# Patient Record
Sex: Female | Born: 1955
Health system: Southern US, Community
[De-identification: ages and names within clinical notes are randomized; demographics above are authoritative.]

## PROBLEM LIST (undated history)

## (undated) DIAGNOSIS — R87619 Unspecified abnormal cytological findings in specimens from cervix uteri: Secondary | ICD-10-CM

## (undated) DIAGNOSIS — J45909 Unspecified asthma, uncomplicated: Secondary | ICD-10-CM

## (undated) DIAGNOSIS — T7840XA Allergy, unspecified, initial encounter: Secondary | ICD-10-CM

## (undated) DIAGNOSIS — E785 Hyperlipidemia, unspecified: Secondary | ICD-10-CM

---

## 2014-11-05 ENCOUNTER — Emergency Department
Admission: EM | Admit: 2014-11-05 | Discharge: 2014-11-05 | Disposition: A | Discharge status: Home / Self Care | Payer: 59 | Attending: Emergency Medicine | Admitting: Emergency Medicine

## 2014-11-05 ENCOUNTER — Encounter: Payer: Self-pay | Admitting: Occupational Medicine

## 2014-11-05 ENCOUNTER — Emergency Department: Payer: 59

## 2014-11-05 DIAGNOSIS — S3992XA Unspecified injury of lower back, initial encounter: Secondary | ICD-10-CM | POA: Insufficient documentation

## 2014-11-05 DIAGNOSIS — S161XXA Strain of muscle, fascia and tendon at neck level, initial encounter: Secondary | ICD-10-CM | POA: Insufficient documentation

## 2014-11-05 DIAGNOSIS — Y9389 Activity, other specified: Secondary | ICD-10-CM | POA: Diagnosis not present

## 2014-11-05 DIAGNOSIS — Y998 Other external cause status: Secondary | ICD-10-CM | POA: Insufficient documentation

## 2014-11-05 DIAGNOSIS — Y92410 Unspecified street and highway as the place of occurrence of the external cause: Secondary | ICD-10-CM | POA: Diagnosis not present

## 2014-11-05 DIAGNOSIS — S199XXA Unspecified injury of neck, initial encounter: Secondary | ICD-10-CM | POA: Diagnosis present

## 2014-11-05 MED ORDER — TRAMADOL HCL 50 MG PO TABS: 50.0000 mg | ORAL_TABLET | Freq: Four times a day (QID) | ORAL | Status: AC | PRN

## 2014-11-05 NOTE — Discharge Instructions (Signed)
Cervical Sprain A cervical sprain is when the tissues (ligaments) that hold the neck bones in place stretch or tear. HOME CARE   Put ice on the injured area.  Put ice in a plastic bag.  Place a towel between your skin and the bag.  Leave the ice on for 15-20 minutes, 3-4 times a day.  You may have been given a collar to wear. This collar keeps your neck from moving while you heal.  Do not take the collar off unless told by your doctor.  If you have long hair, keep it outside of the collar.  Ask your doctor before changing the position of your collar. You may need to change its position over time to make it more comfortable.  If you are allowed to take off the collar for cleaning or bathing, follow your doctor's instructions on how to do it safely.  Keep your collar clean by wiping it with mild soap and water. Dry it completely. If the collar has removable pads, remove them every 1-2 days to hand wash them with soap and water. Allow them to air dry. They should be dry before you wear them in the collar.  Do not drive while wearing the collar.  Only take medicine as told by your doctor.  Keep all doctor visits as told.  Keep all physical therapy visits as told.  Adjust your work station so that you have good posture while you work.  Avoid positions and activities that make your problems worse.  Warm up and stretch before being active. GET HELP IF:  Your pain is not controlled with medicine.  You cannot take less pain medicine over time as planned.  Your activity level does not improve as expected. GET HELP RIGHT AWAY IF:   You are bleeding.  Your stomach is upset.  You have an allergic reaction to your medicine.  You develop new problems that you cannot explain.  You lose feeling (become numb) or you cannot move any part of your body (paralysis).  You have tingling or weakness in any part of your body.  Your symptoms get worse. Symptoms include:  Pain,  soreness, stiffness, puffiness (swelling), or a burning feeling in your neck.  Pain when your neck is touched.  Shoulder or upper back pain.  Limited ability to move your neck.  Headache.  Dizziness.  Your hands or arms feel week, lose feeling, or tingle.  Muscle spasms.  Difficulty swallowing or chewing. MAKE SURE YOU:   Understand these instructions.  Will watch your condition.  Will get help right away if you are not doing well or get worse. Document Released: 11/25/2007 Document Revised: 02/08/2013 Document Reviewed: 12/14/2012 [employer] Patient Information 2015 [employer]. This information is not intended to replace advice given to you by your health care provider. Make sure you discuss any questions you have with your health care provider.  
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
## 2016-06-30 DIAGNOSIS — M791 Myalgia: Secondary | ICD-10-CM | POA: Diagnosis not present

## 2016-06-30 DIAGNOSIS — G8929 Other chronic pain: Secondary | ICD-10-CM | POA: Diagnosis not present

## 2016-06-30 DIAGNOSIS — M25571 Pain in right ankle and joints of right foot: Secondary | ICD-10-CM | POA: Diagnosis not present

## 2016-07-21 DIAGNOSIS — E782 Mixed hyperlipidemia: Secondary | ICD-10-CM | POA: Diagnosis not present

## 2016-07-21 DIAGNOSIS — R400 Somnolence: Secondary | ICD-10-CM | POA: Diagnosis not present

## 2016-07-21 DIAGNOSIS — M545 Low back pain: Secondary | ICD-10-CM | POA: Diagnosis not present

## 2016-07-21 DIAGNOSIS — Z23 Encounter for immunization: Secondary | ICD-10-CM | POA: Diagnosis not present

## 2016-07-23 ENCOUNTER — Other Ambulatory Visit: Payer: Self-pay | Admitting: Internal Medicine

## 2016-07-23 DIAGNOSIS — M79604 Pain in right leg: Secondary | ICD-10-CM

## 2016-07-23 DIAGNOSIS — M545 Low back pain: Secondary | ICD-10-CM

## 2016-07-28 DIAGNOSIS — E7800 Pure hypercholesterolemia, unspecified: Secondary | ICD-10-CM | POA: Diagnosis not present

## 2016-08-01 ENCOUNTER — Ambulatory Visit
Admission: RE | Admit: 2016-08-01 | Discharge: 2016-08-01 | Disposition: A | Discharge status: Home / Self Care | Payer: 59 | Source: Ambulatory Visit | Attending: Internal Medicine | Admitting: Internal Medicine

## 2016-08-01 DIAGNOSIS — M47896 Other spondylosis, lumbar region: Secondary | ICD-10-CM | POA: Diagnosis not present

## 2016-08-01 DIAGNOSIS — M545 Low back pain: Secondary | ICD-10-CM

## 2016-08-01 DIAGNOSIS — M5126 Other intervertebral disc displacement, lumbar region: Secondary | ICD-10-CM | POA: Diagnosis not present

## 2016-08-01 DIAGNOSIS — M5127 Other intervertebral disc displacement, lumbosacral region: Secondary | ICD-10-CM | POA: Insufficient documentation

## 2016-08-01 DIAGNOSIS — M79604 Pain in right leg: Secondary | ICD-10-CM

## 2016-09-16 DIAGNOSIS — E782 Mixed hyperlipidemia: Secondary | ICD-10-CM | POA: Diagnosis not present

## 2016-09-16 DIAGNOSIS — M79604 Pain in right leg: Secondary | ICD-10-CM | POA: Diagnosis not present

## 2016-09-16 DIAGNOSIS — M5442 Lumbago with sciatica, left side: Secondary | ICD-10-CM | POA: Diagnosis not present

## 2016-10-09 DIAGNOSIS — E7800 Pure hypercholesterolemia, unspecified: Secondary | ICD-10-CM | POA: Diagnosis not present

## 2016-11-05 DIAGNOSIS — M5441 Lumbago with sciatica, right side: Secondary | ICD-10-CM | POA: Diagnosis not present

## 2016-11-05 DIAGNOSIS — M79605 Pain in left leg: Secondary | ICD-10-CM | POA: Diagnosis not present

## 2016-11-05 DIAGNOSIS — M79604 Pain in right leg: Secondary | ICD-10-CM | POA: Diagnosis not present

## 2016-11-26 DIAGNOSIS — G629 Polyneuropathy, unspecified: Secondary | ICD-10-CM | POA: Diagnosis not present

## 2016-11-26 DIAGNOSIS — M5441 Lumbago with sciatica, right side: Secondary | ICD-10-CM | POA: Diagnosis not present

## 2016-11-26 DIAGNOSIS — M79604 Pain in right leg: Secondary | ICD-10-CM | POA: Diagnosis not present

## 2016-12-09 DIAGNOSIS — E7800 Pure hypercholesterolemia, unspecified: Secondary | ICD-10-CM | POA: Diagnosis not present

## 2017-02-18 DIAGNOSIS — G629 Polyneuropathy, unspecified: Secondary | ICD-10-CM | POA: Diagnosis not present

## 2017-05-24 DIAGNOSIS — M25562 Pain in left knee: Secondary | ICD-10-CM | POA: Diagnosis not present

## 2017-05-24 DIAGNOSIS — S8392XA Sprain of unspecified site of left knee, initial encounter: Secondary | ICD-10-CM | POA: Diagnosis not present

## 2017-05-27 DIAGNOSIS — S8392XD Sprain of unspecified site of left knee, subsequent encounter: Secondary | ICD-10-CM | POA: Diagnosis not present

## 2017-06-03 DIAGNOSIS — M25562 Pain in left knee: Secondary | ICD-10-CM | POA: Diagnosis not present

## 2017-12-13 DIAGNOSIS — S92902A Unspecified fracture of left foot, initial encounter for closed fracture: Secondary | ICD-10-CM | POA: Diagnosis not present

## 2017-12-13 DIAGNOSIS — M79672 Pain in left foot: Secondary | ICD-10-CM | POA: Diagnosis not present

## 2017-12-13 DIAGNOSIS — M25572 Pain in left ankle and joints of left foot: Secondary | ICD-10-CM | POA: Diagnosis not present

## 2017-12-14 DIAGNOSIS — S93602A Unspecified sprain of left foot, initial encounter: Secondary | ICD-10-CM | POA: Diagnosis not present

## 2017-12-14 DIAGNOSIS — M79672 Pain in left foot: Secondary | ICD-10-CM | POA: Diagnosis not present

## 2018-03-25 DIAGNOSIS — H43811 Vitreous degeneration, right eye: Secondary | ICD-10-CM | POA: Diagnosis not present

## 2018-05-08 DIAGNOSIS — J019 Acute sinusitis, unspecified: Secondary | ICD-10-CM | POA: Diagnosis not present

## 2018-05-08 DIAGNOSIS — M778 Other enthesopathies, not elsewhere classified: Secondary | ICD-10-CM | POA: Diagnosis not present

## 2018-05-08 DIAGNOSIS — H1031 Unspecified acute conjunctivitis, right eye: Secondary | ICD-10-CM | POA: Diagnosis not present

## 2018-08-03 DIAGNOSIS — E782 Mixed hyperlipidemia: Secondary | ICD-10-CM | POA: Diagnosis not present

## 2018-08-03 DIAGNOSIS — Z79899 Other long term (current) drug therapy: Secondary | ICD-10-CM | POA: Diagnosis not present

## 2018-08-03 DIAGNOSIS — Z23 Encounter for immunization: Secondary | ICD-10-CM | POA: Diagnosis not present

## 2018-08-03 DIAGNOSIS — R030 Elevated blood-pressure reading, without diagnosis of hypertension: Secondary | ICD-10-CM | POA: Diagnosis not present

## 2018-08-04 ENCOUNTER — Other Ambulatory Visit: Payer: Self-pay | Admitting: Internal Medicine

## 2018-08-04 DIAGNOSIS — Z803 Family history of malignant neoplasm of breast: Secondary | ICD-10-CM

## 2018-08-04 DIAGNOSIS — Z1231 Encounter for screening mammogram for malignant neoplasm of breast: Secondary | ICD-10-CM

## 2018-08-24 DIAGNOSIS — Z1211 Encounter for screening for malignant neoplasm of colon: Secondary | ICD-10-CM | POA: Diagnosis not present

## 2018-08-24 DIAGNOSIS — Z01818 Encounter for other preprocedural examination: Secondary | ICD-10-CM | POA: Diagnosis not present

## 2018-10-06 DIAGNOSIS — E782 Mixed hyperlipidemia: Secondary | ICD-10-CM | POA: Diagnosis not present

## 2018-10-06 DIAGNOSIS — R21 Rash and other nonspecific skin eruption: Secondary | ICD-10-CM | POA: Diagnosis not present

## 2018-10-11 DIAGNOSIS — E782 Mixed hyperlipidemia: Secondary | ICD-10-CM | POA: Diagnosis not present

## 2018-10-11 DIAGNOSIS — Z79899 Other long term (current) drug therapy: Secondary | ICD-10-CM | POA: Diagnosis not present

## 2018-10-11 DIAGNOSIS — R21 Rash and other nonspecific skin eruption: Secondary | ICD-10-CM | POA: Diagnosis not present

## 2018-10-19 ENCOUNTER — Ambulatory Visit: Admit: 2018-10-19 | Payer: 59 | Admitting: Internal Medicine

## 2018-10-19 SURGERY — COLONOSCOPY WITH PROPOFOL
Anesthesia: General

## 2018-12-01 ENCOUNTER — Other Ambulatory Visit: Payer: Self-pay

## 2018-12-01 ENCOUNTER — Ambulatory Visit
Admission: RE | Admit: 2018-12-01 | Discharge: 2018-12-01 | Disposition: A | Discharge status: Home / Self Care | Payer: 59 | Source: Ambulatory Visit | Attending: Internal Medicine | Admitting: Internal Medicine

## 2018-12-01 ENCOUNTER — Encounter (INDEPENDENT_AMBULATORY_CARE_PROVIDER_SITE_OTHER): Payer: Self-pay

## 2018-12-01 DIAGNOSIS — Z1231 Encounter for screening mammogram for malignant neoplasm of breast: Secondary | ICD-10-CM | POA: Diagnosis present

## 2018-12-01 DIAGNOSIS — Z803 Family history of malignant neoplasm of breast: Secondary | ICD-10-CM | POA: Diagnosis present

## 2019-12-05 ENCOUNTER — Other Ambulatory Visit: Payer: Self-pay | Admitting: Internal Medicine

## 2019-12-05 DIAGNOSIS — Z1231 Encounter for screening mammogram for malignant neoplasm of breast: Secondary | ICD-10-CM

## 2021-02-11 ENCOUNTER — Other Ambulatory Visit: Payer: Self-pay | Admitting: Family Medicine

## 2021-02-11 DIAGNOSIS — Z1231 Encounter for screening mammogram for malignant neoplasm of breast: Secondary | ICD-10-CM

## 2021-02-25 ENCOUNTER — Other Ambulatory Visit: Payer: Self-pay

## 2021-02-25 ENCOUNTER — Ambulatory Visit
Admission: RE | Admit: 2021-02-25 | Discharge: 2021-02-25 | Disposition: A | Discharge status: Home / Self Care | Payer: No Typology Code available for payment source | Source: Ambulatory Visit | Attending: Family Medicine | Admitting: Family Medicine

## 2021-02-25 DIAGNOSIS — Z1231 Encounter for screening mammogram for malignant neoplasm of breast: Secondary | ICD-10-CM | POA: Insufficient documentation

## 2022-04-02 ENCOUNTER — Other Ambulatory Visit: Payer: Self-pay | Admitting: Family Medicine

## 2022-04-02 DIAGNOSIS — Z1231 Encounter for screening mammogram for malignant neoplasm of breast: Secondary | ICD-10-CM

## 2022-05-06 ENCOUNTER — Ambulatory Visit
Admission: RE | Admit: 2022-05-06 | Discharge: 2022-05-06 | Disposition: A | Discharge status: Home / Self Care | Payer: Medicare Other | Source: Ambulatory Visit | Attending: Family Medicine | Admitting: Family Medicine

## 2022-05-06 DIAGNOSIS — Z1231 Encounter for screening mammogram for malignant neoplasm of breast: Secondary | ICD-10-CM | POA: Diagnosis not present

## 2022-05-11 ENCOUNTER — Other Ambulatory Visit: Payer: Self-pay | Admitting: Family Medicine

## 2022-05-11 DIAGNOSIS — R928 Other abnormal and inconclusive findings on diagnostic imaging of breast: Secondary | ICD-10-CM

## 2022-05-11 DIAGNOSIS — N6489 Other specified disorders of breast: Secondary | ICD-10-CM

## 2022-05-26 ENCOUNTER — Ambulatory Visit
Admission: RE | Admit: 2022-05-26 | Discharge: 2022-05-26 | Disposition: A | Discharge status: Home / Self Care | Payer: Medicare Other | Source: Ambulatory Visit | Attending: Family Medicine | Admitting: Family Medicine

## 2022-05-26 DIAGNOSIS — N6489 Other specified disorders of breast: Secondary | ICD-10-CM | POA: Diagnosis present

## 2022-05-26 DIAGNOSIS — R928 Other abnormal and inconclusive findings on diagnostic imaging of breast: Secondary | ICD-10-CM | POA: Insufficient documentation

## 2022-12-07 ENCOUNTER — Encounter: Payer: Self-pay | Admitting: Gastroenterology

## 2022-12-14 ENCOUNTER — Encounter: Admission: RE | Payer: Self-pay | Source: Home / Self Care

## 2022-12-14 ENCOUNTER — Ambulatory Visit: Admission: RE | Admit: 2022-12-14 | Payer: Medicare Other | Source: Home / Self Care | Admitting: Gastroenterology

## 2022-12-14 HISTORY — DX: Hyperlipidemia, unspecified: E78.5

## 2022-12-14 HISTORY — DX: Allergy, unspecified, initial encounter: T78.40XA

## 2022-12-14 HISTORY — DX: Unspecified abnormal cytological findings in specimens from cervix uteri: R87.619

## 2022-12-14 HISTORY — DX: Unspecified asthma, uncomplicated: J45.909

## 2022-12-14 SURGERY — COLONOSCOPY WITH PROPOFOL
Anesthesia: General

## 2023-02-06 IMAGING — MG MM DIGITAL SCREENING BILAT W/ TOMO AND CAD
8 series · 8 of 24 positions shown · non-contrast
Comparison: Previous exam(s).

CLINICAL DATA: Screening.

EXAM:
DIGITAL SCREENING BILATERAL MAMMOGRAM WITH TOMOSYNTHESIS AND CAD
TECHNIQUE: Bilateral screening digital craniocaudal and mediolateral oblique
mammograms were obtained. Bilateral screening digital breast
tomosynthesis was performed. The images were evaluated with
computer-aided detection.

[L CC synth-2D]
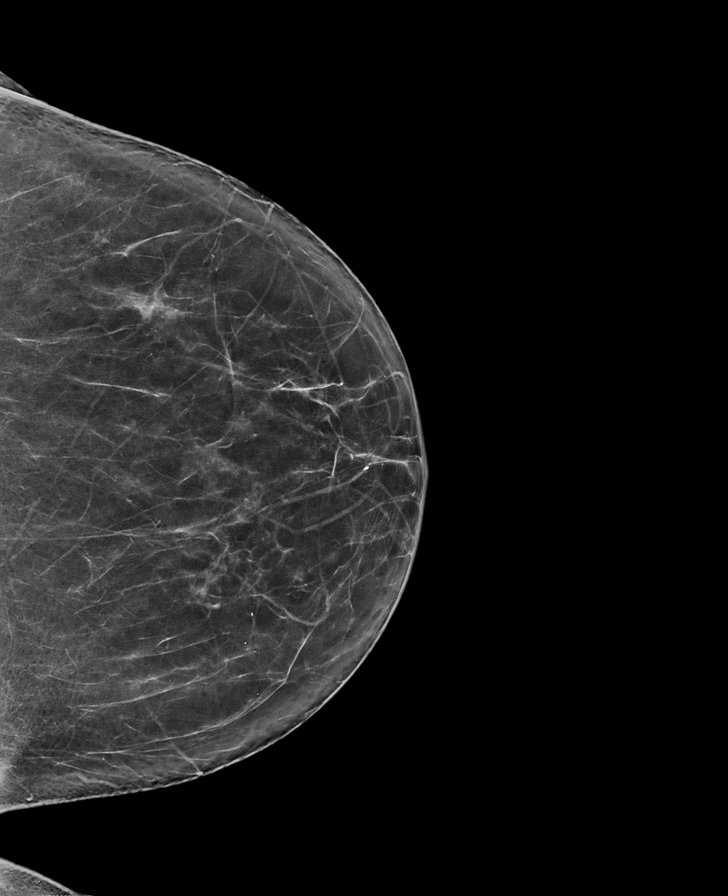

[L MLO synth-2D]
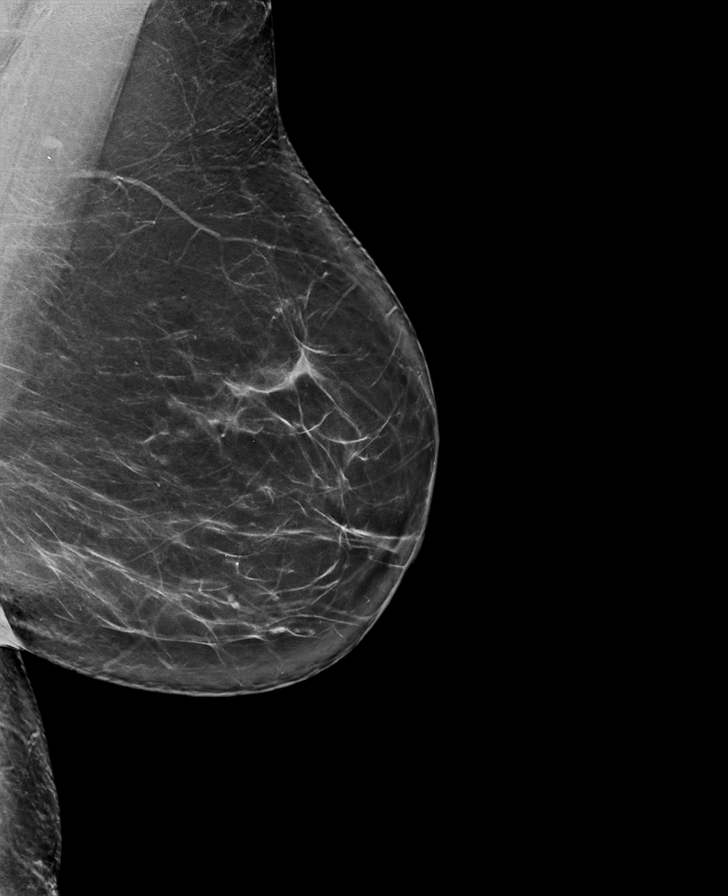

[R MLO synth-2D]
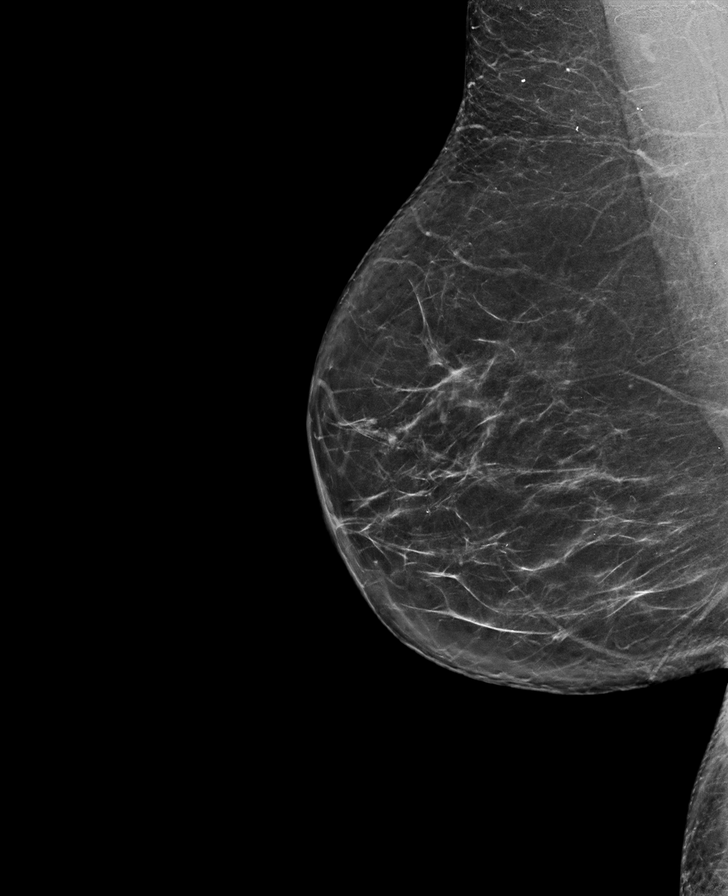

[R CC synth-2D]
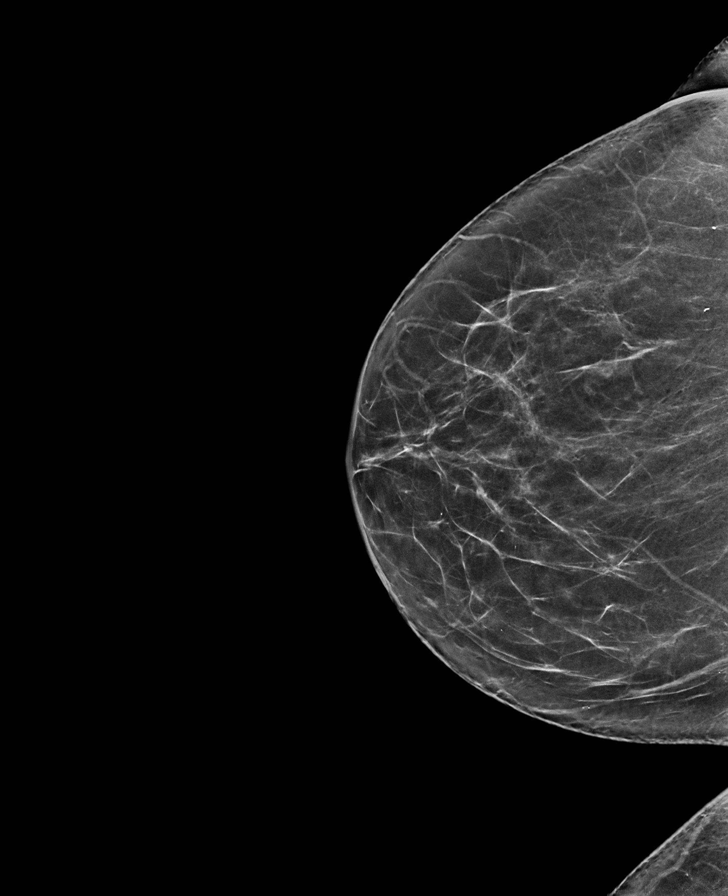

[L MLO tomo · tomo slice 46/91.0]
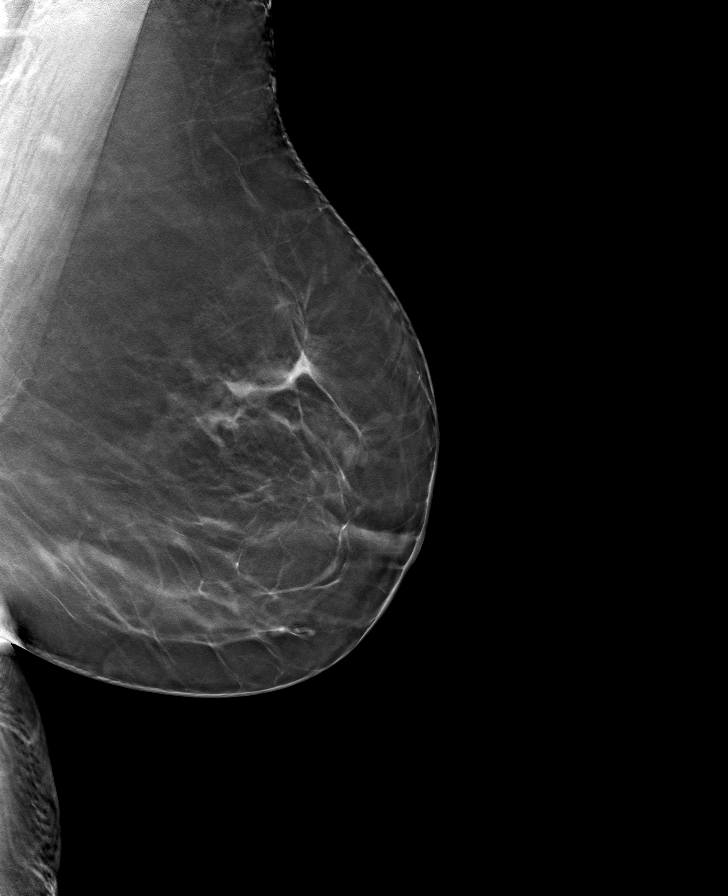

[L CC tomo · tomo slice 39/77.0]
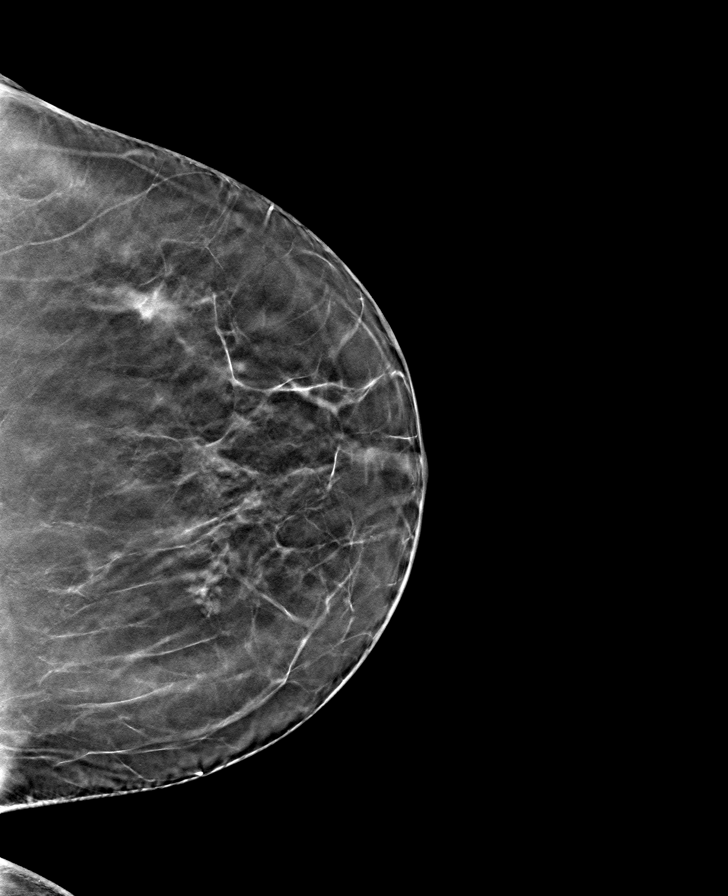

[R CC tomo · tomo slice 37/74.0]
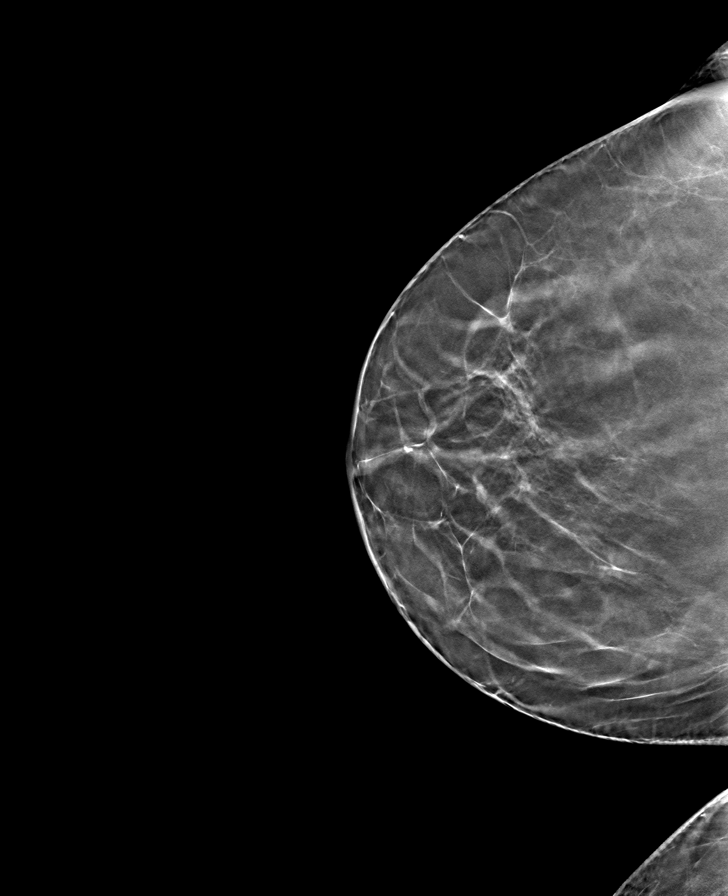

[R MLO tomo · tomo slice 42/83.0]
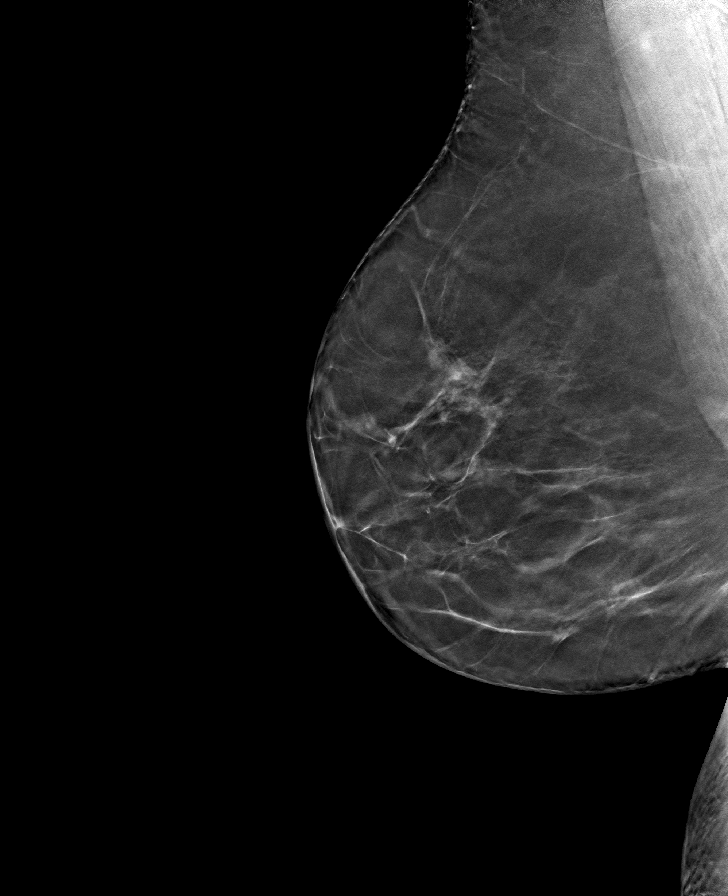

[8 of 24 positions shown; findings below may reference images not displayed]

ACR Breast Density Category b: There are scattered areas of
fibroglandular density.
FINDINGS: There are no findings suspicious for malignancy.
IMPRESSION: No mammographic evidence of malignancy. A result letter of this
screening mammogram will be mailed directly to the patient.

RECOMMENDATION:
Screening mammogram in one year. (Code:51-O-LD2)

BI-RADS CATEGORY  1: Negative.

## 2023-07-26 ENCOUNTER — Other Ambulatory Visit: Payer: Self-pay | Admitting: Family Medicine

## 2023-07-26 DIAGNOSIS — Z1231 Encounter for screening mammogram for malignant neoplasm of breast: Secondary | ICD-10-CM

## 2023-07-30 ENCOUNTER — Ambulatory Visit
Admission: RE | Admit: 2023-07-30 | Discharge: 2023-07-30 | Disposition: A | Discharge status: Home / Self Care | Payer: Medicare Other | Source: Ambulatory Visit | Attending: Family Medicine | Admitting: Family Medicine

## 2023-07-30 DIAGNOSIS — Z1231 Encounter for screening mammogram for malignant neoplasm of breast: Secondary | ICD-10-CM | POA: Insufficient documentation

## 2023-10-25 ENCOUNTER — Encounter: Attending: Cardiology

## 2023-10-25 ENCOUNTER — Other Ambulatory Visit: Payer: Self-pay

## 2023-10-25 DIAGNOSIS — Z955 Presence of coronary angioplasty implant and graft: Secondary | ICD-10-CM | POA: Insufficient documentation

## 2023-10-25 NOTE — Progress Notes (Signed)
 Virtual Visit completed. Patient informed on EP and RD appointment and 6 Minute walk test. Patient also informed of patient health questionnaires on My Chart. Patient Verbalizes understanding. Visit diagnosis can be found in [facility] 08/24/2023.

## 2023-11-01 ENCOUNTER — Encounter

## 2023-11-01 VITALS — Ht 63.25 in | Wt 237.9 lb

## 2023-11-01 DIAGNOSIS — Z955 Presence of coronary angioplasty implant and graft: Secondary | ICD-10-CM | POA: Diagnosis present

## 2023-11-01 NOTE — Patient Instructions (Addendum)
 Patient Instructions  Patient Details  Name: Dana Irwin MRN: [id number] Date of Birth: 03/01/1956 Referring Provider:  Marcheta Seta, MD  Below are your personal goals for exercise, nutrition, and risk factors. Our goal is to help you stay on track towards obtaining and maintaining these goals. We will be discussing your progress on these goals with you throughout the program.  Initial Exercise Prescription:  Initial Exercise Prescription - 11/01/23 1100       Date of Initial Exercise RX and Referring Provider   Date 11/01/23    Referring Provider Dr. Teddie Favre, MD      Oxygen   Maintain Oxygen Saturation 88% or higher      Treadmill   MPH 2.4    Grade 0.5    Minutes 15    METs 3      NuStep   Level 2    SPM 80    Minutes 15    METs 2.29      Biostep-RELP   Level 2    SPM 50    Minutes 15    METs 2.29      Prescription Details   Frequency (times per week) 3    Duration Progress to 30 minutes of continuous aerobic without signs/symptoms of physical distress      Intensity   THRR 40-80% of Max Heartrate 101-135    Ratings of Perceived Exertion 11-13    Perceived Dyspnea 0-4      Progression   Progression Continue to progress workloads to maintain intensity without signs/symptoms of physical distress.      Resistance Training   Training Prescription Yes    Weight 3 lb    Reps 10-15             Exercise Goals: Frequency: Be able to perform aerobic exercise two to three times per week in program working toward 2-5 days per week of home exercise.  Intensity: Work with a perceived exertion of 11 (fairly light) - 15 (hard) while following your exercise prescription.  We will make changes to your prescription with you as you progress through the program.   Duration: Be able to do 30 to 45 minutes of continuous aerobic exercise in addition to a 5 minute warm-up and a 5 minute cool-down routine.   Nutrition Goals: Your personal nutrition goals will be  established when you do your nutrition analysis with the dietician.  The following are general nutrition guidelines to follow: Cholesterol < 200mg /day Sodium < 1500mg /day Fiber: Women over 50 yrs - 21 grams per day  Personal Goals:  Personal Goals and Risk Factors at Admission - 10/25/23 1424       Core Components/Risk Factors/Patient Goals on Admission    Weight Management Yes;Weight Loss    Intervention Weight Management: Provide education and appropriate resources to help participant work on and attain dietary goals.;Weight Management: Develop a combined nutrition and exercise program designed to reach desired caloric intake, while maintaining appropriate intake of nutrient and fiber, sodium and fats, and appropriate energy expenditure required for the weight goal.;Weight Management/Obesity: Establish reasonable short term and long term weight goals.;Obesity: Provide education and appropriate resources to help participant work on and attain dietary goals.    Expected Outcomes Short Term: Continue to assess and modify interventions until short term weight is achieved;Understanding recommendations for meals to include 15-35% energy as protein, 25-35% energy from fat, 35-60% energy from carbohydrates, less than 200mg  of dietary cholesterol, 20-35 gm of total fiber daily;Understanding of distribution  of calorie intake throughout the day with the consumption of 4-5 meals/snacks;Weight Loss: Understanding of general recommendations for a balanced deficit meal plan, which promotes 1-2 lb weight loss per week and includes a negative energy balance of [phone] kcal/d    Hypertension Yes    Intervention Provide education on lifestyle modifcations including regular physical activity/exercise, weight management, moderate sodium restriction and increased consumption of fresh fruit, vegetables, and low fat dairy, alcohol moderation, and smoking cessation.;Monitor prescription use compliance.    Expected  Outcomes Short Term: Continued assessment and intervention until BP is < 140/21mm HG in hypertensive participants. < 130/30mm HG in hypertensive participants with diabetes, heart failure or chronic kidney disease.;Long Term: Maintenance of blood pressure at goal levels.    Lipids Yes    Intervention Provide education and support for participant on nutrition & aerobic/resistive exercise along with prescribed medications to achieve LDL 70mg , HDL >40mg .    Expected Outcomes Short Term: Participant states understanding of desired cholesterol values and is compliant with medications prescribed. Participant is following exercise prescription and nutrition guidelines.;Long Term: Cholesterol controlled with medications as prescribed, with individualized exercise RX and with personalized nutrition plan. Value goals: LDL < 70mg , HDL > 40 mg.            Exercise Goals and Review:  Exercise Goals     Row Name 11/01/23 1110             Exercise Goals   Increase Physical Activity Yes       Intervention Provide advice, education, support and counseling about physical activity/exercise needs.;Develop an individualized exercise prescription for aerobic and resistive training based on initial evaluation findings, risk stratification, comorbidities and participant's personal goals.       Expected Outcomes Short Term: Attend rehab on a regular basis to increase amount of physical activity.;Long Term: Add in home exercise to make exercise part of routine and to increase amount of physical activity.;Long Term: Exercising regularly at least 3-5 days a week.       Increase Strength and Stamina Yes       Intervention Provide advice, education, support and counseling about physical activity/exercise needs.;Develop an individualized exercise prescription for aerobic and resistive training based on initial evaluation findings, risk stratification, comorbidities and participant's personal goals.       Expected Outcomes  Short Term: Increase workloads from initial exercise prescription for resistance, speed, and METs.;Short Term: Perform resistance training exercises routinely during rehab and add in resistance training at home;Long Term: Improve cardiorespiratory fitness, muscular endurance and strength as measured by increased METs and functional capacity ( )       Able to understand and use rate of perceived exertion (RPE) scale Yes       Intervention Provide education and explanation on how to use RPE scale       Expected Outcomes Short Term: Able to use RPE daily in rehab to express subjective intensity level;Long Term:  Able to use RPE to guide intensity level when exercising independently       Able to understand and use Dyspnea scale Yes       Intervention Provide education and explanation on how to use Dyspnea scale       Expected Outcomes Short Term: Able to use Dyspnea scale daily in rehab to express subjective sense of shortness of breath during exertion;Long Term: Able to use Dyspnea scale to guide intensity level when exercising independently       Knowledge and understanding of Target Heart Rate Range (  THRR) Yes       Intervention Provide education and explanation of THRR including how the numbers were predicted and where they are located for reference       Expected Outcomes Short Term: Able to state/look up THRR;Long Term: Able to use THRR to govern intensity when exercising independently;Short Term: Able to use daily as guideline for intensity in rehab       Able to check pulse independently Yes       Intervention Provide education and demonstration on how to check pulse in carotid and radial arteries.;Review the importance of being able to check your own pulse for safety during independent exercise       Expected Outcomes Short Term: Able to explain why pulse checking is important during independent exercise;Long Term: Able to check pulse independently and accurately       Understanding of Exercise  Prescription Yes       Intervention Provide education, explanation, and written materials on patient's individual exercise prescription       Expected Outcomes Short Term: Able to explain program exercise prescription;Long Term: Able to explain home exercise prescription to exercise independently

## 2023-11-01 NOTE — Progress Notes (Signed)
 Cardiac Individual Treatment Plan  Patient Details  Name: Marguerette Garski MRN: [id number] Date of Birth: 03-Apr-1956 Referring Provider:   Flowsheet Row Cardiac Rehab from 11/01/2023 in [facility] Cardiac and Pulmonary Rehab  Referring Provider Dr. Teddie Favre, MD       Initial Encounter Date:  Flowsheet Row Cardiac Rehab from 11/01/2023 in [facility] Cardiac and Pulmonary Rehab  Date 11/01/23       Visit Diagnosis: Status post coronary artery stent placement  Patient's Home Medications on Admission:  Current Outpatient Medications:    acetaminophen (TYLENOL) 325 MG tablet, Take 650 mg by mouth., Disp: , Rfl:    acetaminophen (TYLENOL) 650 MG CR tablet, Take 650 mg by mouth every 8 (eight) hours as needed for pain., Disp: , Rfl:    albuterol (VENTOLIN HFA) 108 (90 Base) MCG/ACT inhaler, Inhale 2 puffs into the lungs., Disp: , Rfl:    aspirin EC 81 MG tablet, Take 81 mg by mouth., Disp: , Rfl:    cefdinir (OMNICEF) 300 MG capsule, Take 300 mg by mouth 2 (two) times daily. (Patient not taking: Reported on 10/25/2023), Disp: , Rfl:    cetirizine (ZYRTEC) 10 MG chewable tablet, Chew 10 mg by mouth daily. As needed (Patient not taking: Reported on 10/25/2023), Disp: , Rfl:    cetirizine (ZYRTEC) 10 MG tablet, Take 10 mg by mouth. (Patient not taking: Reported on 10/25/2023), Disp: , Rfl:    clopidogrel (PLAVIX) 75 MG tablet, Take 75 mg by mouth., Disp: , Rfl:    colchicine 0.6 MG tablet, Take 0.6 mg by mouth. (Patient not taking: Reported on 10/25/2023), Disp: , Rfl:    Digestive Enzymes (DIGESTIVE ENZYME PO), Take 1 tablet by mouth daily., Disp: , Rfl:    ibuprofen (ADVIL) 200 MG tablet, Take 200 mg by mouth every 6 (six) hours as needed for mild pain. As needed (Patient not taking: Reported on 10/25/2023), Disp: , Rfl:    ibuprofen (ADVIL) 600 MG tablet, TAKE 1 TABLET BY MOUTH EVERY 8 HOURS FOR 14 DAYS (Patient not taking: Reported on 10/25/2023), Disp: , Rfl:    Multiple Vitamin (MULTIVITAMIN) tablet, Take 1  tablet by mouth daily., Disp: , Rfl:    rosuvastatin (CRESTOR) 20 MG tablet, Take 20 mg by mouth at bedtime. (Patient not taking: Reported on 10/25/2023), Disp: , Rfl:    rosuvastatin (CRESTOR) 40 MG tablet, Take 40 mg by mouth., Disp: , Rfl:    triamcinolone cream (KENALOG) 0.1 %, Apply 1 Application topically 2 (two) times daily. As needed (Patient not taking: Reported on 10/25/2023), Disp: , Rfl:    triamcinolone cream (KENALOG) 0.1 %, Apply 1 Application topically. (Patient not taking: Reported on 10/25/2023), Disp: , Rfl:   Past Medical History: Past Medical History:  Diagnosis Date   Abnormal Pap smear of cervix    Allergies    Asthma    Hyperlipidemia     Tobacco Use: Social History   Tobacco Use  Smoking Status Never  Smokeless Tobacco Not on file    Labs: Review Flowsheet        No data to display           Exercise Target Goals: Exercise Program Goal: Individual exercise prescription set using results from initial 6 min walk test and THRR while considering  patient's activity barriers and safety.   Exercise Prescription Goal: Initial exercise prescription builds to 30-45 minutes a day of aerobic activity, 2-3 days per week.  Home exercise guidelines will be given to patient during program as  part of exercise prescription that the participant will acknowledge.   Education: Aerobic Exercise: - Group verbal and visual presentation on the components of exercise prescription. Introduces F.I.T.T principle from ACSM for exercise prescriptions.  Reviews F.I.T.T. principles of aerobic exercise including progression. Written material given at graduation. Flowsheet Row Cardiac Rehab from 11/01/2023 in [facility] Cardiac and Pulmonary Rehab  Education need identified 11/01/23       Education: Resistance Exercise: - Group verbal and visual presentation on the components of exercise prescription. Introduces F.I.T.T principle from ACSM for exercise prescriptions  Reviews F.I.T.T.  principles of resistance exercise including progression. Written material given at graduation.    Education: Exercise & Equipment Safety: - Individual verbal instruction and demonstration of equipment use and safety with use of the equipment. Flowsheet Row Cardiac Rehab from 11/01/2023 in [facility] Cardiac and Pulmonary Rehab  Date 10/25/23  Educator jh  Instruction Review Code 1- Verbalizes Understanding       Education: Exercise Physiology & General Exercise Guidelines: - Group verbal and written instruction with models to review the exercise physiology of the cardiovascular system and associated critical values. Provides general exercise guidelines with specific guidelines to those with heart or lung disease.    Education: Flexibility, Balance, Mind/Body Relaxation: - Group verbal and visual presentation with interactive activity on the components of exercise prescription. Introduces F.I.T.T principle from ACSM for exercise prescriptions. Reviews F.I.T.T. principles of flexibility and balance exercise training including progression. Also discusses the mind body connection.  Reviews various relaxation techniques to help reduce and manage stress (i.e. Deep breathing, progressive muscle relaxation, and visualization). Balance handout provided to take home. Written material given at graduation.   Activity Barriers & Risk Stratification:  Activity Barriers & Cardiac Risk Stratification - 11/01/23 1110       Activity Barriers & Cardiac Risk Stratification   Activity Barriers Arthritis;Other (comment)    Comments Hx L foot injury, arthritis in knees    Cardiac Risk Stratification Moderate             6 Minute Walk:  6 Minute Walk     Row Name 11/01/23 1034         6 Minute Walk   Phase Initial     Distance 1245 feet     Walk Time 6 minutes     # of Rest Breaks 0     MPH 2.36     METS 2.29     RPE 7     Perceived Dyspnea  0     VO2 Peak 8     Symptoms No     Resting HR 67  bpm     Resting BP 132/76     Resting Oxygen Saturation  97 %     Exercise Oxygen Saturation  during 6 min walk 96 %     Max Ex. HR 102 bpm     Max Ex. BP 146/84     2 Minute Post BP 140/82              Oxygen Initial Assessment:   Oxygen Re-Evaluation:   Oxygen Discharge (Final Oxygen Re-Evaluation):   Initial Exercise Prescription:  Initial Exercise Prescription - 11/01/23 1100       Date of Initial Exercise RX and Referring Provider   Date 11/01/23    Referring Provider Dr. Teddie Favre, MD      Oxygen   Maintain Oxygen Saturation 88% or higher      Treadmill   MPH 2.4  Grade 0.5    Minutes 15    METs 3      NuStep   Level 2    SPM 80    Minutes 15    METs 2.29      Biostep-RELP   Level 2    SPM 50    Minutes 15    METs 2.29      Prescription Details   Frequency (times per week) 3    Duration Progress to 30 minutes of continuous aerobic without signs/symptoms of physical distress      Intensity   THRR 40-80% of Max Heartrate 101-135    Ratings of Perceived Exertion 11-13    Perceived Dyspnea 0-4      Progression   Progression Continue to progress workloads to maintain intensity without signs/symptoms of physical distress.      Resistance Training   Training Prescription Yes    Weight 3 lb    Reps 10-15             Perform Capillary Blood Glucose checks as needed.  Exercise Prescription Changes:   Exercise Prescription Changes     Row Name 11/01/23 1100             Response to Exercise   Blood Pressure (Admit) 132/76       Blood Pressure (Exercise) 146/84       Blood Pressure (Exit) 140/82       Heart Rate (Admit) 67 bpm       Heart Rate (Exercise) 102 bpm       Heart Rate (Exit) 74 bpm       Oxygen Saturation (Admit) 97 %       Oxygen Saturation (Exercise) 96 %       Rating of Perceived Exertion (Exercise) 7       Perceived Dyspnea (Exercise) 0       Symptoms none       Comments Results                 Exercise Comments:   Exercise Goals and Review:   Exercise Goals     Row Name 11/01/23 1110             Exercise Goals   Increase Physical Activity Yes       Intervention Provide advice, education, support and counseling about physical activity/exercise needs.;Develop an individualized exercise prescription for aerobic and resistive training based on initial evaluation findings, risk stratification, comorbidities and participant's personal goals.       Expected Outcomes Short Term: Attend rehab on a regular basis to increase amount of physical activity.;Long Term: Add in home exercise to make exercise part of routine and to increase amount of physical activity.;Long Term: Exercising regularly at least 3-5 days a week.       Increase Strength and Stamina Yes       Intervention Provide advice, education, support and counseling about physical activity/exercise needs.;Develop an individualized exercise prescription for aerobic and resistive training based on initial evaluation findings, risk stratification, comorbidities and participant's personal goals.       Expected Outcomes Short Term: Increase workloads from initial exercise prescription for resistance, speed, and METs.;Short Term: Perform resistance training exercises routinely during rehab and add in resistance training at home;Long Term: Improve cardiorespiratory fitness, muscular endurance and strength as measured by increased METs and functional capacity ( )       Able to understand and use rate of perceived exertion (RPE) scale Yes  Intervention Provide education and explanation on how to use RPE scale       Expected Outcomes Short Term: Able to use RPE daily in rehab to express subjective intensity level;Long Term:  Able to use RPE to guide intensity level when exercising independently       Able to understand and use Dyspnea scale Yes       Intervention Provide education and explanation on how to use Dyspnea scale        Expected Outcomes Short Term: Able to use Dyspnea scale daily in rehab to express subjective sense of shortness of breath during exertion;Long Term: Able to use Dyspnea scale to guide intensity level when exercising independently       Knowledge and understanding of Target Heart Rate Range (THRR) Yes       Intervention Provide education and explanation of THRR including how the numbers were predicted and where they are located for reference       Expected Outcomes Short Term: Able to state/look up THRR;Long Term: Able to use THRR to govern intensity when exercising independently;Short Term: Able to use daily as guideline for intensity in rehab       Able to check pulse independently Yes       Intervention Provide education and demonstration on how to check pulse in carotid and radial arteries.;Review the importance of being able to check your own pulse for safety during independent exercise       Expected Outcomes Short Term: Able to explain why pulse checking is important during independent exercise;Long Term: Able to check pulse independently and accurately       Understanding of Exercise Prescription Yes       Intervention Provide education, explanation, and written materials on patient's individual exercise prescription       Expected Outcomes Short Term: Able to explain program exercise prescription;Long Term: Able to explain home exercise prescription to exercise independently                Exercise Goals Re-Evaluation :   Discharge Exercise Prescription (Final Exercise Prescription Changes):  Exercise Prescription Changes - 11/01/23 1100       Response to Exercise   Blood Pressure (Admit) 132/76    Blood Pressure (Exercise) 146/84    Blood Pressure (Exit) 140/82    Heart Rate (Admit) 67 bpm    Heart Rate (Exercise) 102 bpm    Heart Rate (Exit) 74 bpm    Oxygen Saturation (Admit) 97 %    Oxygen Saturation (Exercise) 96 %    Rating of Perceived Exertion (Exercise) 7     Perceived Dyspnea (Exercise) 0    Symptoms none    Comments Results             Nutrition:  Target Goals: Understanding of nutrition guidelines, daily intake of sodium 1500mg , cholesterol 200mg , calories 30% from fat and 7% or less from saturated fats, daily to have 5 or more servings of fruits and vegetables.  Education: All About Nutrition: -Group instruction provided by verbal, written material, interactive activities, discussions, models, and posters to present general guidelines for heart healthy nutrition including fat, fiber, MyPlate, the role of sodium in heart healthy nutrition, utilization of the nutrition label, and utilization of this knowledge for meal planning. Follow up email sent as well. Written material given at graduation. Flowsheet Row Cardiac Rehab from 11/01/2023 in [facility] Cardiac and Pulmonary Rehab  Education need identified 11/01/23       Biometrics:  Pre Biometrics -  11/01/23 1111       Pre Biometrics   Height 5' 3.25" (1.607 m)    Weight 237 lb 14.4 oz (107.9 kg)    Waist Circumference 44.5 inches    Hip Circumference 51.5 inches    Waist to Hip Ratio 0.86 %    BMI (Calculated) 41.79    Single Leg Stand 20 seconds              Nutrition Therapy Plan and Nutrition Goals:  Nutrition Therapy & Goals - 11/01/23 1029       Nutrition Therapy   RD appointment deferred Yes      Intervention Plan   Intervention Prescribe, educate and counsel regarding individualized specific dietary modifications aiming towards targeted core components such as weight, hypertension, lipid management, diabetes, heart failure and other comorbidities.    Expected Outcomes Long Term Goal: Adherence to prescribed nutrition plan.;Short Term Goal: A plan has been developed with personal nutrition goals set during dietitian appointment.;Short Term Goal: Understand basic principles of dietary content, such as calories, fat, sodium, cholesterol and nutrients.              Nutrition Assessments:  MEDIFICTS Score Key: >=70 Need to make dietary changes  40-70 Heart Healthy Diet <= 40 Therapeutic Level Cholesterol Diet  Flowsheet Row Cardiac Rehab from 11/01/2023 in [facility] Cardiac and Pulmonary Rehab  Picture Your Plate Total Score on Admission 74      Picture Your Plate Scores: <45 Unhealthy dietary pattern with much room for improvement. 41-50 Dietary pattern unlikely to meet recommendations for good health and room for improvement. 51-60 More healthful dietary pattern, with some room for improvement.  >60 Healthy dietary pattern, although there may be some specific behaviors that could be improved.    Nutrition Goals Re-Evaluation:   Nutrition Goals Discharge (Final Nutrition Goals Re-Evaluation):   Psychosocial: Target Goals: Acknowledge presence or absence of significant depression and/or stress, maximize coping skills, provide positive support system. Participant is able to verbalize types and ability to use techniques and skills needed for reducing stress and depression.   Education: Stress, Anxiety, and Depression - Group verbal and visual presentation to define topics covered.  Reviews how body is impacted by stress, anxiety, and depression.  Also discusses healthy ways to reduce stress and to treat/manage anxiety and depression.  Written material given at graduation.   Education: Sleep Hygiene -Provides group verbal and written instruction about how sleep can affect your health.  Define sleep hygiene, discuss sleep cycles and impact of sleep habits. Review good sleep hygiene tips.    Initial Review & Psychosocial Screening:  Initial Psych Review & Screening - 10/25/23 1424       Initial Review   Current issues with None Identified      Family Dynamics   Good Support System? Yes    Comments She lives alone and has a good support system, Her children live close [place] and has three dogs. She states no issues with mental illness and  does not take anything for her mood.      Barriers   Psychosocial barriers to participate in program There are no identifiable barriers or psychosocial needs.;The patient should benefit from training in stress management and relaxation.      Screening Interventions   Interventions Encouraged to exercise;To provide support and resources with identified psychosocial needs;Provide feedback about the scores to participant    Expected Outcomes Short Term goal: Utilizing psychosocial counselor, staff and physician to assist with identification of  specific Stressors or current issues interfering with healing process. Setting desired goal for each stressor or current issue identified.;Long Term Goal: Stressors or current issues are controlled or eliminated.;Short Term goal: Identification and review with participant of any Quality of Life or Depression concerns found by scoring the questionnaire.;Long Term goal: The participant improves quality of Life and PHQ9 Scores as seen by post scores and/or verbalization of changes             Quality of Life Scores:   Quality of Life - 11/01/23 1028       Quality of Life   Select Quality of Life      Quality of Life Scores   Health/Function Pre 19 %    Socioeconomic Pre 25.63 %    Psych/Spiritual Pre 23.93 %    Family Pre 19.3 %    GLOBAL Pre 21.54 %            Scores of 19 and below usually indicate a poorer quality of life in these areas.  A difference of  2-3 points is a clinically meaningful difference.  A difference of 2-3 points in the total score of the Quality of Life Index has been associated with significant improvement in overall quality of life, self-image, physical symptoms, and general health in studies assessing change in quality of life.  PHQ-9: Review Flowsheet       11/01/2023  Depression screen PHQ 2/9  Decreased Interest 0  Down, Depressed, Hopeless 0  PHQ - 2 Score 0  Altered sleeping 1  Tired, decreased energy 1   Change in appetite 1  Feeling bad or failure about yourself  0  Trouble concentrating 0  Moving slowly or fidgety/restless 0  Suicidal thoughts 0  PHQ-9 Score 3  Difficult doing work/chores Not difficult at all   Interpretation of Total Score  Total Score Depression Severity:  1-4 = Minimal depression, 5-9 = Mild depression, 10-14 = Moderate depression, 15-19 = Moderately severe depression, 20-27 = Severe depression   Psychosocial Evaluation and Intervention:  Psychosocial Evaluation - 10/25/23 1443       Psychosocial Evaluation & Interventions   Interventions Encouraged to exercise with the program and follow exercise prescription;Relaxation education;Stress management education    Comments She lives alone and has a good support system, Her children live close [place] and has three dogs. She states no issues with mental illness and does not take anything for her mood.    Expected Outcomes Short: Start [facility] to help with mood. Long: Maintain a healthy mental state    Continue Psychosocial Services  Follow up required by staff             Psychosocial Re-Evaluation:   Psychosocial Discharge (Final Psychosocial Re-Evaluation):   Vocational Rehabilitation: Provide vocational rehab assistance to qualifying candidates.   Vocational Rehab Evaluation & Intervention:   Education: Education Goals: Education classes will be provided on a variety of topics geared toward better understanding of heart health and risk factor modification. Participant will state understanding/return demonstration of topics presented as noted by education test scores.  Learning Barriers/Preferences:  Learning Barriers/Preferences - 10/25/23 1424       Learning Barriers/Preferences   Learning Barriers None    Learning Preferences None             General Cardiac Education Topics:  AED/CPR: - Group verbal and written instruction with the use of models to demonstrate the basic use of the  AED with the basic ABC's of resuscitation.  Anatomy and Cardiac Procedures: - Group verbal and visual presentation and models provide information about basic cardiac anatomy and function. Reviews the testing methods done to diagnose heart disease and the outcomes of the test results. Describes the treatment choices: Medical Management, Angioplasty, or Coronary Bypass Surgery for treating various heart conditions including Myocardial Infarction, Angina, Valve Disease, and Cardiac Arrhythmias.  Written material given at graduation. Flowsheet Row Cardiac Rehab from 11/01/2023 in [facility] Cardiac and Pulmonary Rehab  Education need identified 11/01/23       Medication Safety: - Group verbal and visual instruction to review commonly prescribed medications for heart and lung disease. Reviews the medication, class of the drug, and side effects. Includes the steps to properly store meds and maintain the prescription regimen.  Written material given at graduation.   Intimacy: - Group verbal instruction through game format to discuss how heart and lung disease can affect sexual intimacy. Written material given at graduation..   Know Your Numbers and Heart Failure: - Group verbal and visual instruction to discuss disease risk factors for cardiac and pulmonary disease and treatment options.  Reviews associated critical values for Overweight/Obesity, Hypertension, Cholesterol, and Diabetes.  Discusses basics of heart failure: signs/symptoms and treatments.  Introduces Heart Failure Zone chart for action plan for heart failure.  Written material given at graduation. Flowsheet Row Cardiac Rehab from 11/01/2023 in [facility] Cardiac and Pulmonary Rehab  Education need identified 11/01/23       Infection Prevention: - Provides verbal and written material to individual with discussion of infection control including proper hand washing and proper equipment cleaning during exercise session. Flowsheet Row Cardiac Rehab  from 11/01/2023 in [facility] Cardiac and Pulmonary Rehab  Date 10/25/23  Educator jh  Instruction Review Code 1- Verbalizes Understanding       Falls Prevention: - Provides verbal and written material to individual with discussion of falls prevention and safety. Flowsheet Row Cardiac Rehab from 11/01/2023 in [facility] Cardiac and Pulmonary Rehab  Date 10/25/23  Educator jh  Instruction Review Code 1- Verbalizes Understanding       Other: -Provides group and verbal instruction on various topics (see comments)   Knowledge Questionnaire Score:  Knowledge Questionnaire Score - 11/01/23 1027       Knowledge Questionnaire Score   Pre Score 18/26             Core Components/Risk Factors/Patient Goals at Admission:  Personal Goals and Risk Factors at Admission - 10/25/23 1424       Core Components/Risk Factors/Patient Goals on Admission    Weight Management Yes;Weight Loss    Intervention Weight Management: Provide education and appropriate resources to help participant work on and attain dietary goals.;Weight Management: Develop a combined nutrition and exercise program designed to reach desired caloric intake, while maintaining appropriate intake of nutrient and fiber, sodium and fats, and appropriate energy expenditure required for the weight goal.;Weight Management/Obesity: Establish reasonable short term and long term weight goals.;Obesity: Provide education and appropriate resources to help participant work on and attain dietary goals.    Expected Outcomes Short Term: Continue to assess and modify interventions until short term weight is achieved;Understanding recommendations for meals to include 15-35% energy as protein, 25-35% energy from fat, 35-60% energy from carbohydrates, less than 200mg  of dietary cholesterol, 20-35 gm of total fiber daily;Understanding of distribution of calorie intake throughout the day with the consumption of 4-5 meals/snacks;Weight Loss: Understanding of  general recommendations for a balanced deficit meal plan, which promotes 1-2 lb weight loss per  week and includes a negative energy balance of [id number] kcal/d    Hypertension Yes    Intervention Provide education on lifestyle modifcations including regular physical activity/exercise, weight management, moderate sodium restriction and increased consumption of fresh fruit, vegetables, and low fat dairy, alcohol moderation, and smoking cessation.;Monitor prescription use compliance.    Expected Outcomes Short Term: Continued assessment and intervention until BP is < 140/1mm HG in hypertensive participants. < 130/61mm HG in hypertensive participants with diabetes, heart failure or chronic kidney disease.;Long Term: Maintenance of blood pressure at goal levels.    Lipids Yes    Intervention Provide education and support for participant on nutrition & aerobic/resistive exercise along with prescribed medications to achieve LDL 70mg , HDL >40mg .    Expected Outcomes Short Term: Participant states understanding of desired cholesterol values and is compliant with medications prescribed. Participant is following exercise prescription and nutrition guidelines.;Long Term: Cholesterol controlled with medications as prescribed, with individualized exercise RX and with personalized nutrition plan. Value goals: LDL < 70mg , HDL > 40 mg.             Education:Diabetes - Individual verbal and written instruction to review signs/symptoms of diabetes, desired ranges of glucose level fasting, after meals and with exercise. Acknowledge that pre and post exercise glucose checks will be done for 3 sessions at entry of program.   Core Components/Risk Factors/Patient Goals Review:    Core Components/Risk Factors/Patient Goals at Discharge (Final Review):    ITP Comments:  ITP Comments     Row Name 10/25/23 1423 11/01/23 1026         ITP Comments Virtual Visit completed. Patient informed on EP and RD appointment  and 6 Minute walk test. Patient also informed of patient health questionnaires on My Chart. Patient Verbalizes understanding. Visit diagnosis can be found in [facility] 08/24/[facility]. Completed and gym orientation for cardiac rehab. Initial ITP created and sent for review to Dr. Firman Hughes, Medical Director.               Comments: Initial ITP

## 2023-11-03 ENCOUNTER — Encounter: Admitting: *Deleted

## 2023-11-03 DIAGNOSIS — Z955 Presence of coronary angioplasty implant and graft: Secondary | ICD-10-CM

## 2023-11-03 NOTE — Progress Notes (Signed)
 Daily Session Note  Patient Details  Name: Dana Irwin MRN: [id number] Date of Birth: 1956/06/10 Referring Provider:   Flowsheet Row Cardiac Rehab from 11/01/2023 in [facility] Cardiac and Pulmonary Rehab  Referring Provider Dr. Teddie Favre, MD       Encounter Date: 11/03/2023  Check In:  Session Check In - 11/03/23 0758       Check-In   Supervising physician immediately available to respond to emergencies See telemetry face sheet for immediately available ER MD    Location [facility]-Cardiac & Pulmonary Rehab    Staff Present Maud Sorenson, RN, BSN, CCRP;Joseph Hood RCP,RRT,BSRT;Maxon Pump Back BS, Exercise Physiologist;Noah Tickle, BS, Exercise Physiologist    Virtual Visit No    Medication changes reported     No    Fall or balance concerns reported    No    Warm-up and Cool-down Performed on first and last piece of equipment    Resistance Training Performed Yes    VAD Patient? No    PAD/SET Patient? No      Pain Assessment   Currently in Pain? No/denies                Social History   Tobacco Use  Smoking Status Never  Smokeless Tobacco Not on file    Goals Met:  Independence with exercise equipment Exercise tolerated well No report of concerns or symptoms today  Goals Unmet:  Not Applicable  Comments: Pt able to follow exercise prescription today without complaint.  Will continue to monitor for progression. First full day of exercise!  Patient was oriented to gym and equipment including functions, settings, policies, and procedures.  Patient's individual exercise prescription and treatment plan were reviewed.  All starting workloads were established based on the results of the 6 minute walk test done at initial orientation visit.  The plan for exercise progression was also introduced and progression will be customized based on patient's performance and goals.   Dr. Firman Hughes is Medical Director for [facility] Cardiac Rehabilitation.  Dr. Fuad Aleskerov is Medical  Director for [facility] Pulmonary Rehabilitation.

## 2023-11-05 ENCOUNTER — Encounter: Admitting: *Deleted

## 2023-11-05 DIAGNOSIS — Z955 Presence of coronary angioplasty implant and graft: Secondary | ICD-10-CM

## 2023-11-05 NOTE — Progress Notes (Signed)
 Daily Session Note  Patient Details  Name: Dana Irwin MRN: [id number] Date of Birth: 1955/09/05 Referring Provider:   Flowsheet Row Cardiac Rehab from 11/01/2023 in [facility] Cardiac and Pulmonary Rehab  Referring Provider Dr. Teddie Favre, MD       Encounter Date: 11/05/2023  Check In:  Session Check In - 11/05/23 0756       Check-In   Supervising physician immediately available to respond to emergencies See telemetry face sheet for immediately available ER MD    Location [facility]-Cardiac & Pulmonary Rehab    Staff Present Maud Sorenson, RN, BSN, CCRP;Joseph Hood RCP,RRT,BSRT;Noah Tickle, [place], Exercise Physiologist    Virtual Visit No    Medication changes reported     No    Fall or balance concerns reported    No    Warm-up and Cool-down Performed on first and last piece of equipment    Resistance Training Performed Yes    VAD Patient? No    PAD/SET Patient? No      Pain Assessment   Currently in Pain? No/denies                Social History   Tobacco Use  Smoking Status Never  Smokeless Tobacco Not on file    Goals Met:  Independence with exercise equipment Exercise tolerated well No report of concerns or symptoms today  Goals Unmet:  Not Applicable  Comments: Pt able to follow exercise prescription today without complaint.  Will continue to monitor for progression.    Dr. Firman Hughes is Medical Director for [facility] Cardiac Rehabilitation.  Dr. Fuad Aleskerov is Medical Director for [facility] Pulmonary Rehabilitation.

## 2023-11-08 ENCOUNTER — Encounter: Admitting: *Deleted

## 2023-11-08 DIAGNOSIS — Z955 Presence of coronary angioplasty implant and graft: Secondary | ICD-10-CM

## 2023-11-08 NOTE — Progress Notes (Signed)
 Daily Session Note  Patient Details  Name: Dana Irwin MRN: [id number] Date of Birth: 07-28-1955 Referring Provider:   Flowsheet Row Cardiac Rehab from 11/01/2023 in [facility] Cardiac and Pulmonary Rehab  Referring Provider Dr. Teddie Favre, MD       Encounter Date: 11/08/2023  Check In:  Session Check In - 11/08/23 0806       Check-In   Supervising physician immediately available to respond to emergencies See telemetry face sheet for immediately available ER MD    Location [facility]-Cardiac & Pulmonary Rehab    Staff Present Maud Sorenson, RN, BSN, CCRP;Noah Tickle, BS, Exercise Physiologist;Kelly Sabra Cramp BS, ACSM CEP, Exercise Physiologist;Jason Martina Sledge RDN,LDN    Virtual Visit No    Medication changes reported     No    Fall or balance concerns reported    No    Warm-up and Cool-down Performed on first and last piece of equipment    Resistance Training Performed Yes    VAD Patient? No    PAD/SET Patient? No      Pain Assessment   Currently in Pain? No/denies                Social History   Tobacco Use  Smoking Status Never  Smokeless Tobacco Not on file    Goals Met:  Independence with exercise equipment Exercise tolerated well No report of concerns or symptoms today  Goals Unmet:  Not Applicable  Comments: Pt able to follow exercise prescription today without complaint.  Will continue to monitor for progression.    Dr. Firman Hughes is Medical Director for [facility] Cardiac Rehabilitation.  Dr. Fuad Aleskerov is Medical Director for [facility] Pulmonary Rehabilitation.

## 2023-11-09 ENCOUNTER — Encounter: Admitting: *Deleted

## 2023-11-09 DIAGNOSIS — Z955 Presence of coronary angioplasty implant and graft: Secondary | ICD-10-CM

## 2023-11-09 NOTE — Progress Notes (Signed)
 Daily Session Note  Patient Details  Name: Dana Irwin MRN: [id number] Date of Birth: [birth year] Referring Provider:   Flowsheet Row Cardiac Rehab from 11/01/2023 in [facility] Cardiac and Pulmonary Rehab  Referring Provider Dr. Teddie Favre, MD       Encounter Date: 11/09/2023  Check In:  Session Check In - 11/09/23 0757       Check-In   Supervising physician immediately available to respond to emergencies See telemetry face sheet for immediately available ER MD    Location [facility]-Cardiac & Pulmonary Rehab    Staff Present Maud Sorenson, RN, BSN, CCRP;Noah Tickle, BS, Exercise Physiologist;Margaret Best, MS, Exercise Physiologist;Jason Martina Sledge RDN,LDN    Virtual Visit No    Medication changes reported     No    Fall or balance concerns reported    No    Warm-up and Cool-down Performed on first and last piece of equipment    Resistance Training Performed Yes    VAD Patient? No    PAD/SET Patient? No      Pain Assessment   Currently in Pain? No/denies                Social History   Tobacco Use  Smoking Status Never  Smokeless Tobacco Not on file    Goals Met:  Independence with exercise equipment Exercise tolerated well No report of concerns or symptoms today  Goals Unmet:  Not Applicable  Comments: Pt able to follow exercise prescription today without complaint.  Will continue to monitor for progression.    Dr. Firman Hughes is Medical Director for [facility] Cardiac Rehabilitation.  Dr. Fuad Aleskerov is Medical Director for [facility] Pulmonary Rehabilitation.

## 2023-11-10 ENCOUNTER — Encounter: Admitting: *Deleted

## 2023-11-10 DIAGNOSIS — Z955 Presence of coronary angioplasty implant and graft: Secondary | ICD-10-CM | POA: Diagnosis not present

## 2023-11-10 NOTE — Progress Notes (Signed)
 Daily Session Note  Patient Details  Name: Dana Irwin MRN: [id number] Date of Birth: 01/24/1956 Referring Provider:   Flowsheet Row Cardiac Rehab from 11/01/2023 in [facility] Cardiac and Pulmonary Rehab  Referring Provider Dr. Teddie Favre, MD       Encounter Date: 11/10/2023  Check In:  Session Check In - 11/10/23 0756       Check-In   Supervising physician immediately available to respond to emergencies See telemetry face sheet for immediately available ER MD    Location [facility]-Cardiac & Pulmonary Rehab    Staff Present Maud Sorenson, RN, BSN, CCRP;Maxon Conetta BS, Exercise Physiologist;Noah Tickle, BS, Exercise Physiologist;Jason Martina Sledge RDN,LDN    Virtual Visit No    Medication changes reported     No    Fall or balance concerns reported    No    Warm-up and Cool-down Performed on first and last piece of equipment    Resistance Training Performed Yes    VAD Patient? No    PAD/SET Patient? No      Pain Assessment   Currently in Pain? No/denies                Social History   Tobacco Use  Smoking Status Never  Smokeless Tobacco Not on file    Goals Met:  Independence with exercise equipment Exercise tolerated well No report of concerns or symptoms today  Goals Unmet:  Not Applicable  Comments: Pt able to follow exercise prescription today without complaint.  Will continue to monitor for progression.    Dr. Firman Hughes is Medical Director for [facility] Cardiac Rehabilitation.  Dr. Fuad Aleskerov is Medical Director for [facility] Pulmonary Rehabilitation.

## 2023-11-12 ENCOUNTER — Encounter

## 2023-11-17 ENCOUNTER — Encounter

## 2023-11-19 ENCOUNTER — Encounter

## 2023-11-22 ENCOUNTER — Encounter: Attending: Family Medicine

## 2023-11-22 DIAGNOSIS — Z955 Presence of coronary angioplasty implant and graft: Secondary | ICD-10-CM | POA: Insufficient documentation

## 2023-11-22 NOTE — Progress Notes (Signed)
 Daily Session Note  Patient Details  Name: Dana Irwin MRN: [id number] Date of Birth: Feb 12, 1956 Referring Provider:   Flowsheet Row Cardiac Rehab from 11/01/2023 in [facility] Cardiac and Pulmonary Rehab  Referring Provider Dr. Teddie Favre, MD       Encounter Date: 11/22/2023  Check In:  Session Check In - 11/22/23 0741       Check-In   Supervising physician immediately available to respond to emergencies See telemetry face sheet for immediately available ER MD    Location [facility]-Cardiac & Pulmonary Rehab    Staff Present Gideon Kussmaul RDN,LDN;Macy Lingenfelter Sabra Cramp BS, ACSM CEP, Exercise Physiologist;Jenese Mischke RN,BSN,MPA;Joseph Lacinda Pica RCP,RRT,BSRT    Virtual Visit No    Medication changes reported     No    Fall or balance concerns reported    No    Warm-up and Cool-down Performed on first and last piece of equipment    Resistance Training Performed Yes    VAD Patient? No    PAD/SET Patient? No      Pain Assessment   Currently in Pain? No/denies                Social History   Tobacco Use  Smoking Status Never  Smokeless Tobacco Not on file    Goals Met:  Independence with exercise equipment Exercise tolerated well No report of concerns or symptoms today Strength training completed today  Goals Unmet:  Not Applicable  Comments: Pt able to follow exercise prescription today without complaint.  Will continue to monitor for progression.    Dr. Firman Hughes is Medical Director for [facility] Cardiac Rehabilitation.  Dr. Fuad Aleskerov is Medical Director for [facility] Pulmonary Rehabilitation.

## 2023-11-24 ENCOUNTER — Encounter

## 2023-11-24 ENCOUNTER — Encounter: Payer: Self-pay | Admitting: *Deleted

## 2023-11-24 DIAGNOSIS — Z955 Presence of coronary angioplasty implant and graft: Secondary | ICD-10-CM

## 2023-11-24 NOTE — Progress Notes (Signed)
 Cardiac Individual Treatment Plan  Patient Details  Name: Dana Irwin MRN: [id number] Date of Birth: 27-Sep-1955 Referring Provider:   Flowsheet Row Cardiac Rehab from 11/01/2023 in [facility] Cardiac and Pulmonary Rehab  Referring Provider Dr. Teddie Favre, MD       Initial Encounter Date:  Flowsheet Row Cardiac Rehab from 11/01/2023 in [facility] Cardiac and Pulmonary Rehab  Date 11/01/23       Visit Diagnosis: Status post coronary artery stent placement  Patient's Home Medications on Admission:  Current Outpatient Medications:    acetaminophen (TYLENOL) 325 MG tablet, Take 650 mg by mouth., Disp: , Rfl:    acetaminophen (TYLENOL) 650 MG CR tablet, Take 650 mg by mouth every 8 (eight) hours as needed for pain., Disp: , Rfl:    albuterol (VENTOLIN HFA) 108 (90 Base) MCG/ACT inhaler, Inhale 2 puffs into the lungs., Disp: , Rfl:    aspirin EC 81 MG tablet, Take 81 mg by mouth., Disp: , Rfl:    cefdinir (OMNICEF) 300 MG capsule, Take 300 mg by mouth 2 (two) times daily. (Patient not taking: Reported on 10/25/2023), Disp: , Rfl:    cetirizine (ZYRTEC) 10 MG chewable tablet, Chew 10 mg by mouth daily. As needed (Patient not taking: Reported on 10/25/2023), Disp: , Rfl:    cetirizine (ZYRTEC) 10 MG tablet, Take 10 mg by mouth. (Patient not taking: Reported on 10/25/2023), Disp: , Rfl:    clopidogrel (PLAVIX) 75 MG tablet, Take 75 mg by mouth., Disp: , Rfl:    colchicine 0.6 MG tablet, Take 0.6 mg by mouth. (Patient not taking: Reported on 10/25/2023), Disp: , Rfl:    Digestive Enzymes (DIGESTIVE ENZYME PO), Take 1 tablet by mouth daily., Disp: , Rfl:    ibuprofen (ADVIL) 200 MG tablet, Take 200 mg by mouth every 6 (six) hours as needed for mild pain. As needed (Patient not taking: Reported on 10/25/2023), Disp: , Rfl:    ibuprofen (ADVIL) 600 MG tablet, TAKE 1 TABLET BY MOUTH EVERY 8 HOURS FOR 14 DAYS (Patient not taking: Reported on 10/25/2023), Disp: , Rfl:    Multiple Vitamin (MULTIVITAMIN) tablet, Take 1  tablet by mouth daily., Disp: , Rfl:    rosuvastatin (CRESTOR) 20 MG tablet, Take 20 mg by mouth at bedtime. (Patient not taking: Reported on 10/25/2023), Disp: , Rfl:    rosuvastatin (CRESTOR) 40 MG tablet, Take 40 mg by mouth., Disp: , Rfl:    triamcinolone cream (KENALOG) 0.1 %, Apply 1 Application topically 2 (two) times daily. As needed (Patient not taking: Reported on 10/25/2023), Disp: , Rfl:    triamcinolone cream (KENALOG) 0.1 %, Apply 1 Application topically. (Patient not taking: Reported on 10/25/2023), Disp: , Rfl:   Past Medical History: Past Medical History:  Diagnosis Date   Abnormal Pap smear of cervix    Allergies    Asthma    Hyperlipidemia     Tobacco Use: Social History   Tobacco Use  Smoking Status Never  Smokeless Tobacco Not on file    Labs: Review Flowsheet        No data to display           Exercise Target Goals: Exercise Program Goal: Individual exercise prescription set using results from initial 6 min walk test and THRR while considering  patient's activity barriers and safety.   Exercise Prescription Goal: Initial exercise prescription builds to 30-45 minutes a day of aerobic activity, 2-3 days per week.  Home exercise guidelines will be given to patient during program as  part of exercise prescription that the participant will acknowledge.   Education: Aerobic Exercise: - Group verbal and visual presentation on the components of exercise prescription. Introduces F.I.T.T principle from ACSM for exercise prescriptions.  Reviews F.I.T.T. principles of aerobic exercise including progression. Written material given at graduation. Flowsheet Row Cardiac Rehab from 11/24/2023 in [facility] Cardiac and Pulmonary Rehab  Education need identified 11/01/23       Education: Resistance Exercise: - Group verbal and visual presentation on the components of exercise prescription. Introduces F.I.T.T principle from ACSM for exercise prescriptions  Reviews F.I.T.T.  principles of resistance exercise including progression. Written material given at graduation.    Education: Exercise & Equipment Safety: - Individual verbal instruction and demonstration of equipment use and safety with use of the equipment. Flowsheet Row Cardiac Rehab from 11/24/2023 in [facility] Cardiac and Pulmonary Rehab  Date 10/25/23  Educator jh  Instruction Review Code 1- Verbalizes Understanding       Education: Exercise Physiology & General Exercise Guidelines: - Group verbal and written instruction with models to review the exercise physiology of the cardiovascular system and associated critical values. Provides general exercise guidelines with specific guidelines to those with heart or lung disease.    Education: Flexibility, Balance, Mind/Body Relaxation: - Group verbal and visual presentation with interactive activity on the components of exercise prescription. Introduces F.I.T.T principle from ACSM for exercise prescriptions. Reviews F.I.T.T. principles of flexibility and balance exercise training including progression. Also discusses the mind body connection.  Reviews various relaxation techniques to help reduce and manage stress (i.e. Deep breathing, progressive muscle relaxation, and visualization). Balance handout provided to take home. Written material given at graduation. Flowsheet Row Cardiac Rehab from 11/24/2023 in [facility] Cardiac and Pulmonary Rehab  Date 11/10/23  Educator NT  Instruction Review Code 1- Verbalizes Understanding       Activity Barriers & Risk Stratification:  Activity Barriers & Cardiac Risk Stratification - 11/01/23 1110       Activity Barriers & Cardiac Risk Stratification   Activity Barriers Arthritis;Other (comment)    Comments Hx L foot injury, arthritis in knees    Cardiac Risk Stratification Moderate             6 Minute Walk:  6 Minute Walk     Row Name 11/01/23 1034         6 Minute Walk   Phase Initial     Distance 1245 feet      Walk Time 6 minutes     # of Rest Breaks 0     MPH 2.36     METS 2.29     RPE 7     Perceived Dyspnea  0     VO2 Peak 8     Symptoms No     Resting HR 67 bpm     Resting BP 132/76     Resting Oxygen Saturation  97 %     Exercise Oxygen Saturation  during 6 min walk 96 %     Max Ex. HR 102 bpm     Max Ex. BP 146/84     2 Minute Post BP 140/82              Oxygen Initial Assessment:   Oxygen Re-Evaluation:   Oxygen Discharge (Final Oxygen Re-Evaluation):   Initial Exercise Prescription:  Initial Exercise Prescription - 11/01/23 1100       Date of Initial Exercise RX and Referring Provider   Date 11/01/23    Referring Provider Dr.  Teddie Favre, MD      Oxygen   Maintain Oxygen Saturation 88% or higher      Treadmill   MPH 2.4    Grade 0.5    Minutes 15    METs 3      NuStep   Level 2    SPM 80    Minutes 15    METs 2.29      Biostep-RELP   Level 2    SPM 50    Minutes 15    METs 2.29      Prescription Details   Frequency (times per week) 3    Duration Progress to 30 minutes of continuous aerobic without signs/symptoms of physical distress      Intensity   THRR 40-80% of Max Heartrate 101-135    Ratings of Perceived Exertion 11-13    Perceived Dyspnea 0-4      Progression   Progression Continue to progress workloads to maintain intensity without signs/symptoms of physical distress.      Resistance Training   Training Prescription Yes    Weight 3 lb    Reps 10-15             Perform Capillary Blood Glucose checks as needed.  Exercise Prescription Changes:   Exercise Prescription Changes     Row Name 11/01/23 1100 11/08/23 1400           Response to Exercise   Blood Pressure (Admit) 132/76 124/60      Blood Pressure (Exercise) 146/84 124/70      Blood Pressure (Exit) 140/82 118/70      Heart Rate (Admit) 67 bpm 76 bpm      Heart Rate (Exercise) 102 bpm 120 bpm      Heart Rate (Exit) 74 bpm 84 bpm      Oxygen Saturation  (Admit) 97 % --      Oxygen Saturation (Exercise) 96 % --      Rating of Perceived Exertion (Exercise) 7 12      Perceived Dyspnea (Exercise) 0 0      Symptoms none none      Comments Results First 2 weeks of exercise      Duration -- Progress to 30 minutes of  aerobic without signs/symptoms of physical distress      Intensity -- THRR unchanged        Progression   Progression -- Continue to progress workloads to maintain intensity without signs/symptoms of physical distress.      Average METs -- 2.6        Resistance Training   Training Prescription -- Yes      Weight -- 3lb      Reps -- 10-15        Interval Training   Interval Training -- No        Treadmill   MPH -- 2.4      Grade -- 0.5      Minutes -- 15      METs -- 3        NuStep   Level -- 2      Minutes -- 15      METs -- 2.5        Biostep-RELP   Level -- 2      Minutes -- 15      METs -- 2        Oxygen   Maintain Oxygen Saturation -- 88% or higher  Exercise Comments:   Exercise Comments     Row Name 11/03/23 [phone]           Exercise Comments First full day of exercise!  Patient was oriented to gym and equipment including functions, settings, policies, and procedures.  Patient's individual exercise prescription and treatment plan were reviewed.  All starting workloads were established based on the results of the 6 minute walk test done at initial orientation visit.  The plan for exercise progression was also introduced and progression will be customized based on patient's performance and goals.                Exercise Goals and Review:   Exercise Goals     Row Name 11/01/23 1110             Exercise Goals   Increase Physical Activity Yes       Intervention Provide advice, education, support and counseling about physical activity/exercise needs.;Develop an individualized exercise prescription for aerobic and resistive training based on initial evaluation findings,  risk stratification, comorbidities and participant's personal goals.       Expected Outcomes Short Term: Attend rehab on a regular basis to increase amount of physical activity.;Long Term: Add in home exercise to make exercise part of routine and to increase amount of physical activity.;Long Term: Exercising regularly at least 3-5 days a week.       Increase Strength and Stamina Yes       Intervention Provide advice, education, support and counseling about physical activity/exercise needs.;Develop an individualized exercise prescription for aerobic and resistive training based on initial evaluation findings, risk stratification, comorbidities and participant's personal goals.       Expected Outcomes Short Term: Increase workloads from initial exercise prescription for resistance, speed, and METs.;Short Term: Perform resistance training exercises routinely during rehab and add in resistance training at home;Long Term: Improve cardiorespiratory fitness, muscular endurance and strength as measured by increased METs and functional capacity ( )       Able to understand and use rate of perceived exertion (RPE) scale Yes       Intervention Provide education and explanation on how to use RPE scale       Expected Outcomes Short Term: Able to use RPE daily in rehab to express subjective intensity level;Long Term:  Able to use RPE to guide intensity level when exercising independently       Able to understand and use Dyspnea scale Yes       Intervention Provide education and explanation on how to use Dyspnea scale       Expected Outcomes Short Term: Able to use Dyspnea scale daily in rehab to express subjective sense of shortness of breath during exertion;Long Term: Able to use Dyspnea scale to guide intensity level when exercising independently       Knowledge and understanding of Target Heart Rate Range (THRR) Yes       Intervention Provide education and explanation of THRR including how the numbers were  predicted and where they are located for reference       Expected Outcomes Short Term: Able to state/look up THRR;Long Term: Able to use THRR to govern intensity when exercising independently;Short Term: Able to use daily as guideline for intensity in rehab       Able to check pulse independently Yes       Intervention Provide education and demonstration on how to check pulse in carotid and radial arteries.;Review the importance of being able to check your  own pulse for safety during independent exercise       Expected Outcomes Short Term: Able to explain why pulse checking is important during independent exercise;Long Term: Able to check pulse independently and accurately       Understanding of Exercise Prescription Yes       Intervention Provide education, explanation, and written materials on patient's individual exercise prescription       Expected Outcomes Short Term: Able to explain program exercise prescription;Long Term: Able to explain home exercise prescription to exercise independently                Exercise Goals Re-Evaluation :  Exercise Goals Re-Evaluation     Row Name 11/03/23 0830 11/08/23 1413           Exercise Goal Re-Evaluation   Exercise Goals Review Able to understand and use rate of perceived exertion (RPE) scale;Able to understand and use Dyspnea scale;Knowledge and understanding of Target Heart Rate Range (THRR);Understanding of Exercise Prescription Increase Strength and Stamina;Increase Physical Activity;Understanding of Exercise Prescription      Comments Reviewed RPE and dyspnea scale, THR and program prescription with pt today.  Pt voiced understanding and was given a copy of goals to take home. Dana Irwin is off to a good start in the program. She was able to attend her first 2 sessions during this review period. During the 2 sessions she was able to use the treadmill at a workload of 2. and 0.5%, and the T4 nustep at level 2. We will continue to monitor her  progress in the program.      Expected Outcomes Short: Use RPE daily to regulate intensity. Long: Follow program prescription in THR. Short: Continue to follow exercise prescription. Long: Continue exercise to improve strength and stamina.               Discharge Exercise Prescription (Final Exercise Prescription Changes):  Exercise Prescription Changes - 11/08/23 1400       Response to Exercise   Blood Pressure (Admit) 124/60    Blood Pressure (Exercise) 124/70    Blood Pressure (Exit) 118/70    Heart Rate (Admit) 76 bpm    Heart Rate (Exercise) 120 bpm    Heart Rate (Exit) 84 bpm    Rating of Perceived Exertion (Exercise) 12    Perceived Dyspnea (Exercise) 0    Symptoms none    Comments First 2 weeks of exercise    Duration Progress to 30 minutes of  aerobic without signs/symptoms of physical distress    Intensity THRR unchanged      Progression   Progression Continue to progress workloads to maintain intensity without signs/symptoms of physical distress.    Average METs 2.6      Resistance Training   Training Prescription Yes    Weight 3lb    Reps 10-15      Interval Training   Interval Training No      Treadmill   MPH 2.4    Grade 0.5    Minutes 15    METs 3      NuStep   Level 2    Minutes 15    METs 2.5      Biostep-RELP   Level 2    Minutes 15    METs 2      Oxygen   Maintain Oxygen Saturation 88% or higher             Nutrition:  Target Goals: Understanding of nutrition guidelines, daily intake of  sodium 1500mg , cholesterol 200mg , calories 30% from fat and 7% or less from saturated fats, daily to have 5 or more servings of fruits and vegetables.  Education: All About Nutrition: -Group instruction provided by verbal, written material, interactive activities, discussions, models, and posters to present general guidelines for heart healthy nutrition including fat, fiber, MyPlate, the role of sodium in heart healthy nutrition, utilization of  the nutrition label, and utilization of this knowledge for meal planning. Follow up email sent as well. Written material given at graduation. Flowsheet Row Cardiac Rehab from 11/24/2023 in [facility] Cardiac and Pulmonary Rehab  Education need identified 11/01/23  Date 11/24/23  Educator jg  Instruction Review Code 1- Verbalizes Understanding       Biometrics:  Pre Biometrics - 11/01/23 1111       Pre Biometrics   Height 5' 3.25" (1.607 m)    Weight 237 lb 14.4 oz (107.9 kg)    Waist Circumference 44.5 inches    Hip Circumference 51.5 inches    Waist to Hip Ratio 0.86 %    BMI (Calculated) 41.79    Single Leg Stand 20 seconds              Nutrition Therapy Plan and Nutrition Goals:  Nutrition Therapy & Goals - 11/01/23 1029       Nutrition Therapy   RD appointment deferred Yes      Intervention Plan   Intervention Prescribe, educate and counsel regarding individualized specific dietary modifications aiming towards targeted core components such as weight, hypertension, lipid management, diabetes, heart failure and other comorbidities.    Expected Outcomes Long Term Goal: Adherence to prescribed nutrition plan.;Short Term Goal: A plan has been developed with personal nutrition goals set during dietitian appointment.;Short Term Goal: Understand basic principles of dietary content, such as calories, fat, sodium, cholesterol and nutrients.             Nutrition Assessments:  MEDIFICTS Score Key: >=70 Need to make dietary changes  40-70 Heart Healthy Diet <= 40 Therapeutic Level Cholesterol Diet  Flowsheet Row Cardiac Rehab from 11/01/2023 in [facility] Cardiac and Pulmonary Rehab  Picture Your Plate Total Score on Admission 74      Picture Your Plate Scores: <16 Unhealthy dietary pattern with much room for improvement. 41-50 Dietary pattern unlikely to meet recommendations for good health and room for improvement. 51-60 More healthful dietary pattern, with some room for  improvement.  >60 Healthy dietary pattern, although there may be some specific behaviors that could be improved.    Nutrition Goals Re-Evaluation:   Nutrition Goals Discharge (Final Nutrition Goals Re-Evaluation):   Psychosocial: Target Goals: Acknowledge presence or absence of significant depression and/or stress, maximize coping skills, provide positive support system. Participant is able to verbalize types and ability to use techniques and skills needed for reducing stress and depression.   Education: Stress, Anxiety, and Depression - Group verbal and visual presentation to define topics covered.  Reviews how body is impacted by stress, anxiety, and depression.  Also discusses healthy ways to reduce stress and to treat/manage anxiety and depression.  Written material given at graduation.   Education: Sleep Hygiene -Provides group verbal and written instruction about how sleep can affect your health.  Define sleep hygiene, discuss sleep cycles and impact of sleep habits. Review good sleep hygiene tips.    Initial Review & Psychosocial Screening:  Initial Psych Review & Screening - 10/25/23 1424       Initial Review   Current issues with None  Identified      Family Dynamics   Good Support System? Yes    Comments She lives alone and has a good support system, Her children live close [place] and has three dogs. She states no issues with mental illness and does not take anything for her mood.      Barriers   Psychosocial barriers to participate in program There are no identifiable barriers or psychosocial needs.;The patient should benefit from training in stress management and relaxation.      Screening Interventions   Interventions Encouraged to exercise;To provide support and resources with identified psychosocial needs;Provide feedback about the scores to participant    Expected Outcomes Short Term goal: Utilizing psychosocial counselor, staff and physician to assist with  identification of specific Stressors or current issues interfering with healing process. Setting desired goal for each stressor or current issue identified.;Long Term Goal: Stressors or current issues are controlled or eliminated.;Short Term goal: Identification and review with participant of any Quality of Life or Depression concerns found by scoring the questionnaire.;Long Term goal: The participant improves quality of Life and PHQ9 Scores as seen by post scores and/or verbalization of changes             Quality of Life Scores:   Quality of Life - 11/01/23 1028       Quality of Life   Select Quality of Life      Quality of Life Scores   Health/Function Pre 19 %    Socioeconomic Pre 25.63 %    Psych/Spiritual Pre 23.93 %    Family Pre 19.3 %    GLOBAL Pre 21.54 %            Scores of 19 and below usually indicate a poorer quality of life in these areas.  A difference of  2-3 points is a clinically meaningful difference.  A difference of 2-3 points in the total score of the Quality of Life Index has been associated with significant improvement in overall quality of life, self-image, physical symptoms, and general health in studies assessing change in quality of life.  PHQ-9: Review Flowsheet       11/01/2023  Depression screen PHQ 2/9  Decreased Interest 0  Down, Depressed, Hopeless 0  PHQ - 2 Score 0  Altered sleeping 1  Tired, decreased energy 1  Change in appetite 1  Feeling bad or failure about yourself  0  Trouble concentrating 0  Moving slowly or fidgety/restless 0  Suicidal thoughts 0  PHQ-9 Score 3  Difficult doing work/chores Not difficult at all   Interpretation of Total Score  Total Score Depression Severity:  1-4 = Minimal depression, 5-9 = Mild depression, 10-14 = Moderate depression, 15-19 = Moderately severe depression, 20-27 = Severe depression   Psychosocial Evaluation and Intervention:  Psychosocial Evaluation - 10/25/23 1443        Psychosocial Evaluation & Interventions   Interventions Encouraged to exercise with the program and follow exercise prescription;Relaxation education;Stress management education    Comments She lives alone and has a good support system, Her children live close [place] and has three dogs. She states no issues with mental illness and does not take anything for her mood.    Expected Outcomes Short: Start [facility] to help with mood. Long: Maintain a healthy mental state    Continue Psychosocial Services  Follow up required by staff             Psychosocial Re-Evaluation:   Psychosocial Discharge (Final Psychosocial Re-Evaluation):  Vocational Rehabilitation: Provide vocational rehab assistance to qualifying candidates.   Vocational Rehab Evaluation & Intervention:   Education: Education Goals: Education classes will be provided on a variety of topics geared toward better understanding of heart health and risk factor modification. Participant will state understanding/return demonstration of topics presented as noted by education test scores.  Learning Barriers/Preferences:  Learning Barriers/Preferences - 10/25/23 1424       Learning Barriers/Preferences   Learning Barriers None    Learning Preferences None             General Cardiac Education Topics:  AED/CPR: - Group verbal and written instruction with the use of models to demonstrate the basic use of the AED with the basic ABC's of resuscitation.   Anatomy and Cardiac Procedures: - Group verbal and visual presentation and models provide information about basic cardiac anatomy and function. Reviews the testing methods done to diagnose heart disease and the outcomes of the test results. Describes the treatment choices: Medical Management, Angioplasty, or Coronary Bypass Surgery for treating various heart conditions including Myocardial Infarction, Angina, Valve Disease, and Cardiac Arrhythmias.  Written material given at  graduation. Flowsheet Row Cardiac Rehab from 11/24/2023 in [facility] Cardiac and Pulmonary Rehab  Education need identified 11/01/23       Medication Safety: - Group verbal and visual instruction to review commonly prescribed medications for heart and lung disease. Reviews the medication, class of the drug, and side effects. Includes the steps to properly store meds and maintain the prescription regimen.  Written material given at graduation.   Intimacy: - Group verbal instruction through game format to discuss how heart and lung disease can affect sexual intimacy. Written material given at graduation..   Know Your Numbers and Heart Failure: - Group verbal and visual instruction to discuss disease risk factors for cardiac and pulmonary disease and treatment options.  Reviews associated critical values for Overweight/Obesity, Hypertension, Cholesterol, and Diabetes.  Discusses basics of heart failure: signs/symptoms and treatments.  Introduces Heart Failure Zone chart for action plan for heart failure.  Written material given at graduation. Flowsheet Row Cardiac Rehab from 11/24/2023 in [facility] Cardiac and Pulmonary Rehab  Education need identified 11/01/23       Infection Prevention: - Provides verbal and written material to individual with discussion of infection control including proper hand washing and proper equipment cleaning during exercise session. Flowsheet Row Cardiac Rehab from 11/24/2023 in [facility] Cardiac and Pulmonary Rehab  Date 10/25/23  Educator jh  Instruction Review Code 1- Verbalizes Understanding       Falls Prevention: - Provides verbal and written material to individual with discussion of falls prevention and safety. Flowsheet Row Cardiac Rehab from 11/24/2023 in [facility] Cardiac and Pulmonary Rehab  Date 10/25/23  Educator jh  Instruction Review Code 1- Verbalizes Understanding       Other: -Provides group and verbal instruction on various topics (see  comments)   Knowledge Questionnaire Score:  Knowledge Questionnaire Score - 11/01/23 1027       Knowledge Questionnaire Score   Pre Score 18/26             Core Components/Risk Factors/Patient Goals at Admission:  Personal Goals and Risk Factors at Admission - 10/25/23 1424       Core Components/Risk Factors/Patient Goals on Admission    Weight Management Yes;Weight Loss    Intervention Weight Management: Provide education and appropriate resources to help participant work on and attain dietary goals.;Weight Management: Develop a combined nutrition and exercise program designed  to reach desired caloric intake, while maintaining appropriate intake of nutrient and fiber, sodium and fats, and appropriate energy expenditure required for the weight goal.;Weight Management/Obesity: Establish reasonable short term and long term weight goals.;Obesity: Provide education and appropriate resources to help participant work on and attain dietary goals.    Expected Outcomes Short Term: Continue to assess and modify interventions until short term weight is achieved;Understanding recommendations for meals to include 15-35% energy as protein, 25-35% energy from fat, 35-60% energy from carbohydrates, less than 200mg  of dietary cholesterol, 20-35 gm of total fiber daily;Understanding of distribution of calorie intake throughout the day with the consumption of 4-5 meals/snacks;Weight Loss: Understanding of general recommendations for a balanced deficit meal plan, which promotes 1-2 lb weight loss per week and includes a negative energy balance of [phone] kcal/d    Hypertension Yes    Intervention Provide education on lifestyle modifcations including regular physical activity/exercise, weight management, moderate sodium restriction and increased consumption of fresh fruit, vegetables, and low fat dairy, alcohol moderation, and smoking cessation.;Monitor prescription use compliance.    Expected Outcomes Short  Term: Continued assessment and intervention until BP is < 140/38mm HG in hypertensive participants. < 130/75mm HG in hypertensive participants with diabetes, heart failure or chronic kidney disease.;Long Term: Maintenance of blood pressure at goal levels.    Lipids Yes    Intervention Provide education and support for participant on nutrition & aerobic/resistive exercise along with prescribed medications to achieve LDL 70mg , HDL >40mg .    Expected Outcomes Short Term: Participant states understanding of desired cholesterol values and is compliant with medications prescribed. Participant is following exercise prescription and nutrition guidelines.;Long Term: Cholesterol controlled with medications as prescribed, with individualized exercise RX and with personalized nutrition plan. Value goals: LDL < 70mg , HDL > 40 mg.             Education:Diabetes - Individual verbal and written instruction to review signs/symptoms of diabetes, desired ranges of glucose level fasting, after meals and with exercise. Acknowledge that pre and post exercise glucose checks will be done for 3 sessions at entry of program.   Core Components/Risk Factors/Patient Goals Review:    Core Components/Risk Factors/Patient Goals at Discharge (Final Review):    ITP Comments:  ITP Comments     Row Name 10/25/23 1423 11/01/23 1026 11/03/23 0829 11/24/23 1013     ITP Comments Virtual Visit completed. Patient informed on EP and RD appointment and 6 Minute walk test. Patient also informed of patient health questionnaires on My Chart. Patient Verbalizes understanding. Visit diagnosis can be found in [facility] 08/24/2023. Completed and gym orientation for cardiac rehab. Initial ITP created and sent for review to Dr. Firman Hughes, Medical Director. First full day of exercise!  Patient was oriented to gym and equipment including functions, settings, policies, and procedures.  Patient's individual exercise prescription and treatment  plan were reviewed.  All starting workloads were established based on the results of the 6 minute walk test done at initial orientation visit.  The plan for exercise progression was also introduced and progression will be customized based on patient's performance and goals. 30 Day review completed. Medical Director ITP review done, changes made as directed, and signed approval by Medical Director.    new toprgram             Comments:

## 2023-11-24 NOTE — Progress Notes (Signed)
 Daily Session Note  Patient Details  Name: Dana Irwin MRN: [id number] Date of Birth: 14-Jan-1956 Referring Provider:   Flowsheet Row Cardiac Rehab from 11/01/2023 in [facility] Cardiac and Pulmonary Rehab  Referring Provider Dr. Teddie Favre, MD       Encounter Date: 11/24/2023  Check In:  Session Check In - 11/24/23 0716       Check-In   Supervising physician immediately available to respond to emergencies See telemetry face sheet for immediately available ER MD    Location [facility]-Cardiac & Pulmonary Rehab    Staff Present Josph Nimrod RN,BSN,MPA;Noah Tickle, BS, Exercise Physiologist;Jason Martina Sledge RDN,LDN;Joseph Lacinda Pica RCP,RRT,BSRT    Virtual Visit No    Medication changes reported     No    Fall or balance concerns reported    No    Warm-up and Cool-down Performed on first and last piece of equipment    Resistance Training Performed Yes    VAD Patient? No    PAD/SET Patient? No      Pain Assessment   Currently in Pain? No/denies                Social History   Tobacco Use  Smoking Status Never  Smokeless Tobacco Not on file    Goals Met:  Independence with exercise equipment Exercise tolerated well No report of concerns or symptoms today Strength training completed today  Goals Unmet:  Not Applicable  Comments: Pt able to follow exercise prescription today without complaint.  Will continue to monitor for progression.    Dr. Firman Hughes is Medical Director for [facility] Cardiac Rehabilitation.  Dr. Fuad Aleskerov is Medical Director for [facility] Pulmonary Rehabilitation.

## 2023-11-26 ENCOUNTER — Encounter

## 2023-11-26 DIAGNOSIS — Z955 Presence of coronary angioplasty implant and graft: Secondary | ICD-10-CM | POA: Diagnosis not present

## 2023-11-26 NOTE — Progress Notes (Signed)
 Daily Session Note  Patient Details  Name: Makenna Macaluso MRN: [id number] Date of Birth: July 05, 1955 Referring Provider:   Flowsheet Row Cardiac Rehab from 11/01/2023 in [facility] Cardiac and Pulmonary Rehab  Referring Provider Dr. Teddie Favre, MD       Encounter Date: 11/26/2023  Check In:  Session Check In - 11/26/23 0721       Check-In   Supervising physician immediately available to respond to emergencies See telemetry face sheet for immediately available ER MD    Location [facility]-Cardiac & Pulmonary Rehab    Staff Present Sherle Dire, BS, Exercise Physiologist;Dekisha Mesmer Dawne Euler, ACSM CEP, Exercise Physiologist;Josiah Wojtaszek [facility]    Virtual Visit No    Medication changes reported     No    Fall or balance concerns reported    No    Warm-up and Cool-down Performed on first and last piece of equipment    Resistance Training Performed Yes    VAD Patient? No    PAD/SET Patient? No      Pain Assessment   Currently in Pain? No/denies                Social History   Tobacco Use  Smoking Status Never  Smokeless Tobacco Not on file    Goals Met:  Independence with exercise equipment Exercise tolerated well No report of concerns or symptoms today Strength training completed today  Goals Unmet:  Not Applicable  Comments: Pt able to follow exercise prescription today without complaint.  Will continue to monitor for progression.    Dr. Firman Hughes is Medical Director for [facility] Cardiac Rehabilitation.  Dr. Fuad Aleskerov is Medical Director for [facility] Pulmonary Rehabilitation.

## 2023-11-29 ENCOUNTER — Encounter: Admitting: *Deleted

## 2023-11-29 ENCOUNTER — Encounter

## 2023-11-29 DIAGNOSIS — Z955 Presence of coronary angioplasty implant and graft: Secondary | ICD-10-CM

## 2023-11-29 NOTE — Progress Notes (Signed)
 Daily Session Note  Patient Details  Name: Dana Irwin MRN: [id number] Date of Birth: 05-20-1956 Referring Provider:   Flowsheet Row Cardiac Rehab from 11/01/2023 in [facility] Cardiac and Pulmonary Rehab  Referring Provider Dr. Teddie Favre, MD       Encounter Date: 11/29/2023  Check In:  Session Check In - 11/29/23 1713       Check-In   Supervising physician immediately available to respond to emergencies See telemetry face sheet for immediately available ER MD    Location [facility]-Cardiac & Pulmonary Rehab    Staff Present Sue Em RN,BSN;Denise Rhew PhD, RN,CNS,CEN;Kelly Barb [facility]    Virtual Visit No    Medication changes reported     No    Warm-up and Cool-down Performed on first and last piece of equipment    Resistance Training Performed Yes    VAD Patient? No    PAD/SET Patient? No      Pain Assessment   Currently in Pain? No/denies                Social History   Tobacco Use  Smoking Status Never  Smokeless Tobacco Not on file    Goals Met:  Independence with exercise equipment Exercise tolerated well No report of concerns or symptoms today Strength training completed today  Goals Unmet:  Not Applicable  Comments: Pt able to follow exercise prescription today without complaint.  Will continue to monitor for progression.    Dr. Firman Hughes is Medical Director for [facility] Cardiac Rehabilitation.  Dr. Fuad Aleskerov is Medical Director for [facility] Pulmonary Rehabilitation.

## 2023-12-01 ENCOUNTER — Encounter

## 2023-12-01 DIAGNOSIS — Z955 Presence of coronary angioplasty implant and graft: Secondary | ICD-10-CM

## 2023-12-01 NOTE — Progress Notes (Signed)
 Daily Session Note  Patient Details  Name: Dana Irwin MRN: [id number] Date of Birth: Jul 18, 1955 Referring Provider:   Flowsheet Row Cardiac Rehab from 11/01/2023 in [facility] Cardiac and Pulmonary Rehab  Referring Provider Dr. Teddie Favre, MD       Encounter Date: 12/01/2023  Check In:  Session Check In - 12/01/23 1715       Check-In   Supervising physician immediately available to respond to emergencies See telemetry face sheet for immediately available ER MD    Location [facility]-Cardiac & Pulmonary Rehab    Staff Present Sue Em RN,BSN;Susanne Bice, RN, BSN, CCRP;Kelly Bollinger RN,BSN,MPA    Virtual Visit No    Medication changes reported     No    Fall or balance concerns reported    No    Warm-up and Cool-down Performed on first and last piece of equipment    Resistance Training Performed Yes    VAD Patient? No    PAD/SET Patient? No      Pain Assessment   Currently in Pain? No/denies                Social History   Tobacco Use  Smoking Status Never  Smokeless Tobacco Not on file    Goals Met:  Independence with exercise equipment Exercise tolerated well No report of concerns or symptoms today Strength training completed today  Goals Unmet:  Not Applicable  Comments: Pt able to follow exercise prescription today without complaint.  Will continue to monitor for progression.    Dr. Firman Hughes is Medical Director for [facility] Cardiac Rehabilitation.  Dr. Fuad Aleskerov is Medical Director for [facility] Pulmonary Rehabilitation.

## 2023-12-02 ENCOUNTER — Encounter: Admitting: *Deleted

## 2023-12-02 DIAGNOSIS — Z955 Presence of coronary angioplasty implant and graft: Secondary | ICD-10-CM | POA: Diagnosis not present

## 2023-12-02 NOTE — Progress Notes (Signed)
 Daily Session Note  Patient Details  Name: Dana Irwin MRN: [id number] Date of Birth: 1955/12/19 Referring Provider:   Flowsheet Row Cardiac Rehab from 11/01/2023 in [facility] Cardiac and Pulmonary Rehab  Referring Provider Dr. Teddie Favre, MD    Encounter Date: 12/02/2023  Check In:  Session Check In - 12/02/23 1727       Check-In   Supervising physician immediately available to respond to emergencies See telemetry face sheet for immediately available ER MD    Location [facility]-Cardiac & Pulmonary Rehab    Staff Present Sue Em RN,BSN;Maxon Regan Cao BS, Exercise Physiologist;Margaret Best, MS, Exercise Physiologist    Virtual Visit No    Medication changes reported     No    Fall or balance concerns reported    No    Warm-up and Cool-down Performed on first and last piece of equipment    Resistance Training Performed Yes    VAD Patient? No    PAD/SET Patient? No      Pain Assessment   Currently in Pain? No/denies             Social History   Tobacco Use  Smoking Status Never  Smokeless Tobacco Not on file    Goals Met:  Independence with exercise equipment Exercise tolerated well No report of concerns or symptoms today Strength training completed today  Goals Unmet:  Not Applicable  Comments: Pt able to follow exercise prescription today without complaint.  Will continue to monitor for progression.    Dr. Firman Hughes is Medical Director for [facility] Cardiac Rehabilitation.  Dr. Fuad Aleskerov is Medical Director for [facility] Pulmonary Rehabilitation.

## 2023-12-03 ENCOUNTER — Encounter

## 2023-12-06 ENCOUNTER — Encounter

## 2023-12-06 ENCOUNTER — Encounter: Admitting: *Deleted

## 2023-12-06 DIAGNOSIS — Z955 Presence of coronary angioplasty implant and graft: Secondary | ICD-10-CM | POA: Diagnosis not present

## 2023-12-06 NOTE — Progress Notes (Signed)
 Daily Session Note  Patient Details  Name: Dana Irwin MRN: [id number] Date of Birth: [birth year] Referring Provider:   Flowsheet Row Cardiac Rehab from 11/01/2023 in [facility] Cardiac and Pulmonary Rehab  Referring Provider Dr. Teddie Favre, MD    Encounter Date: 12/06/2023  Check In:  Session Check In - 12/06/23 1717       Check-In   Supervising physician immediately available to respond to emergencies See telemetry face sheet for immediately available ER MD    Location [facility]-Cardiac & Pulmonary Rehab    Staff Present Sue Em RN,BSN;Joseph [facility] Sabra Cramp BS, ACSM CEP, Exercise Physiologist    Virtual Visit No    Medication changes reported     No    Fall or balance concerns reported    No    Warm-up and Cool-down Performed on first and last piece of equipment    Resistance Training Performed Yes    VAD Patient? No    PAD/SET Patient? No      Pain Assessment   Currently in Pain? No/denies             Social History   Tobacco Use  Smoking Status Never  Smokeless Tobacco Not on file    Goals Met:  Independence with exercise equipment Exercise tolerated well No report of concerns or symptoms today Strength training completed today  Goals Unmet:  Not Applicable  Comments: Pt able to follow exercise prescription today without complaint.  Will continue to monitor for progression.    Dr. Firman Hughes is Medical Director for [facility] Cardiac Rehabilitation.  Dr. Fuad Aleskerov is Medical Director for [facility] Pulmonary Rehabilitation.

## 2023-12-08 ENCOUNTER — Encounter: Admitting: *Deleted

## 2023-12-08 ENCOUNTER — Encounter

## 2023-12-08 DIAGNOSIS — Z955 Presence of coronary angioplasty implant and graft: Secondary | ICD-10-CM | POA: Diagnosis not present

## 2023-12-08 NOTE — Progress Notes (Signed)
 Daily Session Note  Patient Details  Name: Dana Irwin MRN: [id number] Date of Birth: 24-Apr-1956 Referring Provider:   Flowsheet Row Cardiac Rehab from 11/01/2023 in [facility] Cardiac and Pulmonary Rehab  Referring Provider Dr. Teddie Favre, MD    Encounter Date: 12/08/2023  Check In:  Session Check In - 12/08/23 1732       Check-In   Supervising physician immediately available to respond to emergencies See telemetry face sheet for immediately available ER MD    Location [facility]-Cardiac & Pulmonary Rehab    Staff Present Sue Em RN,BSN;Joseph [facility] Sabra Cramp BS, ACSM CEP, Exercise Physiologist    Virtual Visit No    Medication changes reported     No    Fall or balance concerns reported    No    Warm-up and Cool-down Performed on first and last piece of equipment    Resistance Training Performed Yes    VAD Patient? No      Pain Assessment   Currently in Pain? No/denies             Social History   Tobacco Use  Smoking Status Never  Smokeless Tobacco Not on file    Goals Met:  Independence with exercise equipment Exercise tolerated well No report of concerns or symptoms today Strength training completed today  Goals Unmet:  Not Applicable  Comments: Pt able to follow exercise prescription today without complaint.  Will continue to monitor for progression.    Dr. Firman Hughes is Medical Director for [facility] Cardiac Rehabilitation.  Dr. Fuad Aleskerov is Medical Director for [facility] Pulmonary Rehabilitation.

## 2023-12-09 ENCOUNTER — Encounter: Admitting: *Deleted

## 2023-12-09 DIAGNOSIS — Z955 Presence of coronary angioplasty implant and graft: Secondary | ICD-10-CM

## 2023-12-09 NOTE — Progress Notes (Signed)
 Daily Session Note  Patient Details  Name: Dana Irwin MRN: [id number] Date of Birth: [birth year] Referring Provider:   Flowsheet Row Cardiac Rehab from 11/01/2023 in [facility] Cardiac and Pulmonary Rehab  Referring Provider Dr. Teddie Favre, MD    Encounter Date: 12/09/2023  Check In:  Session Check In - 12/09/23 1728       Check-In   Supervising physician immediately available to respond to emergencies See telemetry face sheet for immediately available ER MD    Location [facility]-Cardiac & Pulmonary Rehab    Staff Present Sue Em RN,BSN;Joseph Neldon [place], [place], RRT, CPFT;Maxon Conetta BS, Exercise Physiologist    Virtual Visit No    Medication changes reported     No    Fall or balance concerns reported    No    Warm-up and Cool-down Performed on first and last piece of equipment    Resistance Training Performed Yes    VAD Patient? No    PAD/SET Patient? No      Pain Assessment   Currently in Pain? No/denies             Social History   Tobacco Use  Smoking Status Never  Smokeless Tobacco Not on file    Goals Met:  Independence with exercise equipment Exercise tolerated well No report of concerns or symptoms today Strength training completed today  Goals Unmet:  Not Applicable  Comments: Pt able to follow exercise prescription today without complaint.  Will continue to monitor for progression.    Dr. Firman Hughes is Medical Director for [facility] Cardiac Rehabilitation.  Dr. Fuad Aleskerov is Medical Director for [facility] Pulmonary Rehabilitation.

## 2023-12-10 ENCOUNTER — Encounter

## 2023-12-13 ENCOUNTER — Encounter: Admitting: *Deleted

## 2023-12-13 ENCOUNTER — Encounter

## 2023-12-13 DIAGNOSIS — Z955 Presence of coronary angioplasty implant and graft: Secondary | ICD-10-CM | POA: Diagnosis not present

## 2023-12-13 NOTE — Progress Notes (Signed)
 Daily Session Note  Patient Details  Name: Dana Irwin MRN: [id number] Date of Birth: 1955/10/09 Referring Provider:   Flowsheet Row Cardiac Rehab from 11/01/2023 in [facility] Cardiac and Pulmonary Rehab  Referring Provider Dr. Margery Ruth, MD    Encounter Date: 12/13/2023  Check In:  Session Check In - 12/13/23 1706       Check-In   Supervising physician immediately available to respond to emergencies See telemetry face sheet for immediately available ER MD    Location [facility]-Cardiac & Pulmonary Rehab    Staff Present Hoy Rodney RN,BSN;Maxon Burnell BS, Exercise Physiologist;Kelly Dyane HECKLE, ACSM CEP, Exercise Physiologist    Virtual Visit No    Medication changes reported     No    Fall or balance concerns reported    No    Warm-up and Cool-down Performed on first and last piece of equipment    Resistance Training Performed Yes    VAD Patient? No    PAD/SET Patient? No      Pain Assessment   Currently in Pain? No/denies             Social History   Tobacco Use  Smoking Status Never  Smokeless Tobacco Not on file    Goals Met:  Independence with exercise equipment Exercise tolerated well No report of concerns or symptoms today Strength training completed today  Goals Unmet:  Not Applicable  Comments: Pt able to follow exercise prescription today without complaint.  Will continue to monitor for progression.    Dr. Oneil Pinal is Medical Director for [facility] Cardiac Rehabilitation.  Dr. Fuad Aleskerov is Medical Director for [facility] Pulmonary Rehabilitation.

## 2023-12-15 ENCOUNTER — Encounter

## 2023-12-16 ENCOUNTER — Encounter: Admitting: *Deleted

## 2023-12-16 DIAGNOSIS — Z955 Presence of coronary angioplasty implant and graft: Secondary | ICD-10-CM | POA: Diagnosis not present

## 2023-12-16 NOTE — Progress Notes (Signed)
 Daily Session Note  Patient Details  Name: Dana Irwin MRN: [id number] Date of Birth: 12/24/1955 Referring Provider:   Flowsheet Row Cardiac Rehab from 11/01/2023 in [facility] Cardiac and Pulmonary Rehab  Referring Provider Dr. Margery Ruth, MD    Encounter Date: 12/16/2023  Check In:  Session Check In - 12/16/23 1735       Check-In   Supervising physician immediately available to respond to emergencies See telemetry face sheet for immediately available ER MD    Location [facility]-Cardiac & Pulmonary Rehab    Staff Present Hoy Rodney RN,BSN;Joseph [facility] Best, MS, Exercise Physiologist    Virtual Visit No    Medication changes reported     No    Fall or balance concerns reported    No    Warm-up and Cool-down Performed on first and last piece of equipment    Resistance Training Performed Yes    VAD Patient? No    PAD/SET Patient? No      Pain Assessment   Currently in Pain? No/denies             Social History   Tobacco Use  Smoking Status Never  Smokeless Tobacco Not on file    Goals Met:  Independence with exercise equipment Exercise tolerated well No report of concerns or symptoms today Strength training completed today  Goals Unmet:  Not Applicable  Comments: Pt able to follow exercise prescription today without complaint.  Will continue to monitor for progression.    Dr. Oneil Pinal is Medical Director for [facility] Cardiac Rehabilitation.  Dr. Fuad Aleskerov is Medical Director for [facility] Pulmonary Rehabilitation.

## 2023-12-17 ENCOUNTER — Encounter

## 2023-12-20 ENCOUNTER — Encounter

## 2023-12-20 ENCOUNTER — Encounter: Admitting: *Deleted

## 2023-12-20 DIAGNOSIS — Z955 Presence of coronary angioplasty implant and graft: Secondary | ICD-10-CM | POA: Diagnosis not present

## 2023-12-20 NOTE — Progress Notes (Signed)
 Daily Session Note  Patient Details  Name: Minela [place] MRN: [id number] Date of Birth: [birth year] Referring Provider:   Flowsheet Row Cardiac Rehab from 11/01/2023 in [facility] Cardiac and Pulmonary Rehab  Referring Provider Dr. Margery Ruth, MD    Encounter Date: 12/20/2023  Check In:  Session Check In - 12/20/23 1723       Check-In   Supervising physician immediately available to respond to emergencies See telemetry face sheet for immediately available ER MD    Location [facility]-Cardiac & Pulmonary Rehab    Staff Present Hoy Rodney RN,BSN;Margaret Best, MS, Exercise Physiologist;Joseph Rolinda RCP,RRT,BSRT    Virtual Visit No    Medication changes reported     No    Fall or balance concerns reported    No    Warm-up and Cool-down Performed on first and last piece of equipment    Resistance Training Performed Yes    VAD Patient? No    PAD/SET Patient? No      Pain Assessment   Currently in Pain? No/denies             Social History   Tobacco Use  Smoking Status Never  Smokeless Tobacco Not on file    Goals Met:  Independence with exercise equipment Exercise tolerated well No report of concerns or symptoms today Strength training completed today  Goals Unmet:  Not Applicable  Comments: Pt able to follow exercise prescription today without complaint.  Will continue to monitor for progression.    Dr. Oneil Pinal is Medical Director for [facility] Cardiac Rehabilitation.  Dr. Fuad Aleskerov is Medical Director for [facility] Pulmonary Rehabilitation.

## 2023-12-22 ENCOUNTER — Encounter: Payer: Self-pay | Admitting: *Deleted

## 2023-12-22 ENCOUNTER — Encounter: Attending: Family Medicine

## 2023-12-22 DIAGNOSIS — Z955 Presence of coronary angioplasty implant and graft: Secondary | ICD-10-CM | POA: Diagnosis present

## 2023-12-22 NOTE — Progress Notes (Signed)
 Cardiac Individual Treatment Plan  Patient Details  Name: Dana Irwin MRN: [id number] Date of Birth: 1955-12-06 Referring Provider:   Flowsheet Row Cardiac Rehab from 11/01/2023 in [facility] Cardiac and Pulmonary Rehab  Referring Provider Dr. Margery Ruth, MD    Initial Encounter Date:  Flowsheet Row Cardiac Rehab from 11/01/2023 in [facility] Cardiac and Pulmonary Rehab  Date 11/01/23    Visit Diagnosis: Status post coronary artery stent placement  Patient's Home Medications on Admission:  Current Outpatient Medications:    acetaminophen (TYLENOL) 325 MG tablet, Take 650 mg by mouth., Disp: , Rfl:    acetaminophen (TYLENOL) 650 MG CR tablet, Take 650 mg by mouth every 8 (eight) hours as needed for pain., Disp: , Rfl:    albuterol (VENTOLIN HFA) 108 (90 Base) MCG/ACT inhaler, Inhale 2 puffs into the lungs., Disp: , Rfl:    aspirin EC 81 MG tablet, Take 81 mg by mouth., Disp: , Rfl:    cefdinir (OMNICEF) 300 MG capsule, Take 300 mg by mouth 2 (two) times daily. (Patient not taking: Reported on 10/25/2023), Disp: , Rfl:    cetirizine (ZYRTEC) 10 MG chewable tablet, Chew 10 mg by mouth daily. As needed (Patient not taking: Reported on 10/25/2023), Disp: , Rfl:    cetirizine (ZYRTEC) 10 MG tablet, Take 10 mg by mouth. (Patient not taking: Reported on 10/25/2023), Disp: , Rfl:    clopidogrel (PLAVIX) 75 MG tablet, Take 75 mg by mouth., Disp: , Rfl:    colchicine 0.6 MG tablet, Take 0.6 mg by mouth. (Patient not taking: Reported on 10/25/2023), Disp: , Rfl:    Digestive Enzymes (DIGESTIVE ENZYME PO), Take 1 tablet by mouth daily., Disp: , Rfl:    ibuprofen (ADVIL) 200 MG tablet, Take 200 mg by mouth every 6 (six) hours as needed for mild pain. As needed (Patient not taking: Reported on 10/25/2023), Disp: , Rfl:    ibuprofen (ADVIL) 600 MG tablet, TAKE 1 TABLET BY MOUTH EVERY 8 HOURS FOR 14 DAYS (Patient not taking: Reported on 10/25/2023), Disp: , Rfl:    Multiple Vitamin (MULTIVITAMIN) tablet, Take 1 tablet by mouth  daily., Disp: , Rfl:    rosuvastatin (CRESTOR) 20 MG tablet, Take 20 mg by mouth at bedtime. (Patient not taking: Reported on 10/25/2023), Disp: , Rfl:    rosuvastatin (CRESTOR) 40 MG tablet, Take 40 mg by mouth., Disp: , Rfl:    triamcinolone cream (KENALOG) 0.1 %, Apply 1 Application topically 2 (two) times daily. As needed (Patient not taking: Reported on 10/25/2023), Disp: , Rfl:    triamcinolone cream (KENALOG) 0.1 %, Apply 1 Application topically. (Patient not taking: Reported on 10/25/2023), Disp: , Rfl:   Past Medical History: Past Medical History:  Diagnosis Date   Abnormal Pap smear of cervix    Allergies    Asthma    Hyperlipidemia     Tobacco Use: Social History   Tobacco Use  Smoking Status Never  Smokeless Tobacco Not on file    Labs: Review Flowsheet        No data to display           Exercise Target Goals: Exercise Program Goal: Individual exercise prescription set using results from initial 6 min walk test and THRR while considering  patient's activity barriers and safety.   Exercise Prescription Goal: Initial exercise prescription builds to 30-45 minutes a day of aerobic activity, 2-3 days per week.  Home exercise guidelines will be given to patient during program as part of exercise prescription that the  participant will acknowledge.   Education: Aerobic Exercise: - Group verbal and visual presentation on the components of exercise prescription. Introduces F.I.T.T principle from ACSM for exercise prescriptions.  Reviews F.I.T.T. principles of aerobic exercise including progression. Written material given at graduation. Flowsheet Row Cardiac Rehab from 12/08/2023 in [facility] Cardiac and Pulmonary Rehab  Education need identified 11/01/23    Education: Resistance Exercise: - Group verbal and visual presentation on the components of exercise prescription. Introduces F.I.T.T principle from ACSM for exercise prescriptions  Reviews F.I.T.T. principles of resistance  exercise including progression. Written material given at graduation.    Education: Exercise & Equipment Safety: - Individual verbal instruction and demonstration of equipment use and safety with use of the equipment. Flowsheet Row Cardiac Rehab from 12/08/2023 in [facility] Cardiac and Pulmonary Rehab  Date 10/25/23  Educator jh  Instruction Review Code 1- Verbalizes Understanding    Education: Exercise Physiology & General Exercise Guidelines: - Group verbal and written instruction with models to review the exercise physiology of the cardiovascular system and associated critical values. Provides general exercise guidelines with specific guidelines to those with heart or lung disease.    Education: Flexibility, Balance, Mind/Body Relaxation: - Group verbal and visual presentation with interactive activity on the components of exercise prescription. Introduces F.I.T.T principle from ACSM for exercise prescriptions. Reviews F.I.T.T. principles of flexibility and balance exercise training including progression. Also discusses the mind body connection.  Reviews various relaxation techniques to help reduce and manage stress (i.e. Deep breathing, progressive muscle relaxation, and visualization). Balance handout provided to take home. Written material given at graduation. Flowsheet Row Cardiac Rehab from 12/08/2023 in [facility] Cardiac and Pulmonary Rehab  Date 11/10/23  Educator NT  Instruction Review Code 1- Verbalizes Understanding    Activity Barriers & Risk Stratification:  Activity Barriers & Cardiac Risk Stratification - 11/01/23 1110       Activity Barriers & Cardiac Risk Stratification   Activity Barriers Arthritis;Other (comment)    Comments Hx L foot injury, arthritis in knees    Cardiac Risk Stratification Moderate          6 Minute Walk:  6 Minute Walk     Row Name 11/01/23 1034         6 Minute Walk   Phase Initial     Distance 1245 feet     Walk Time 6 minutes     # of  Rest Breaks 0     MPH 2.36     METS 2.29     RPE 7     Perceived Dyspnea  0     VO2 Peak 8     Symptoms No     Resting HR 67 bpm     Resting BP 132/76     Resting Oxygen Saturation  97 %     Exercise Oxygen Saturation  during 6 min walk 96 %     Max Ex. HR 102 bpm     Max Ex. BP 146/84     2 Minute Post BP 140/82        Oxygen Initial Assessment:   Oxygen Re-Evaluation:   Oxygen Discharge (Final Oxygen Re-Evaluation):   Initial Exercise Prescription:  Initial Exercise Prescription - 11/01/23 1100       Date of Initial Exercise RX and Referring Provider   Date 11/01/23    Referring Provider Dr. Margery Ruth, MD      Oxygen   Maintain Oxygen Saturation 88% or higher      Treadmill  MPH 2.4    Grade 0.5    Minutes 15    METs 3      NuStep   Level 2    SPM 80    Minutes 15    METs 2.29      Biostep-RELP   Level 2    SPM 50    Minutes 15    METs 2.29      Prescription Details   Frequency (times per week) 3    Duration Progress to 30 minutes of continuous aerobic without signs/symptoms of physical distress      Intensity   THRR 40-80% of Max Heartrate 101-135    Ratings of Perceived Exertion 11-13    Perceived Dyspnea 0-4      Progression   Progression Continue to progress workloads to maintain intensity without signs/symptoms of physical distress.      Resistance Training   Training Prescription Yes    Weight 3 lb    Reps 10-15          Perform Capillary Blood Glucose checks as needed.  Exercise Prescription Changes:   Exercise Prescription Changes     Row Name 11/01/23 1100 11/08/23 1400 11/24/23 1100 12/07/23 1400       Response to Exercise   Blood Pressure (Admit) 132/76 124/60 120/68 114/68    Blood Pressure (Exercise) 146/84 124/70 132/74 170/80    Blood Pressure (Exit) 140/82 118/70 122/76 138/82    Heart Rate (Admit) 67 bpm 76 bpm 72 bpm 86 bpm    Heart Rate (Exercise) 102 bpm 120 bpm 117 bpm 130 bpm    Heart Rate (Exit) 74  bpm 84 bpm 79 bpm 93 bpm    Oxygen Saturation (Admit) 97 % -- -- --    Oxygen Saturation (Exercise) 96 % -- -- --    Rating of Perceived Exertion (Exercise) 7 12 13 14     Perceived Dyspnea (Exercise) 0 0 -- --    Symptoms none none none none    Comments Results First 2 weeks of exercise -- --    Duration -- Progress to 30 minutes of  aerobic without signs/symptoms of physical distress Continue with 30 min of aerobic exercise without signs/symptoms of physical distress. Continue with 30 min of aerobic exercise without signs/symptoms of physical distress.    Intensity -- THRR unchanged THRR unchanged THRR unchanged      Progression   Progression -- Continue to progress workloads to maintain intensity without signs/symptoms of physical distress. Continue to progress workloads to maintain intensity without signs/symptoms of physical distress. Continue to progress workloads to maintain intensity without signs/symptoms of physical distress.    Average METs -- 2.6 2.56 3.24      Resistance Training   Training Prescription -- Yes Yes Yes    Weight -- 3lb 3lb 3lb    Reps -- 10-15 10-15 10-15      Interval Training   Interval Training -- No No No      Treadmill   MPH -- 2.4 2.4 2.4    Grade -- 0.5 0.5 3    Minutes -- 15 15 15     METs -- 3 3 3.83      Recumbant Bike   Level -- -- 4 --    RPM -- -- 50 --    Watts -- -- 25 --    Minutes -- -- 15 --    METs -- -- 2.73 --      NuStep   Level --  2 2 4     Minutes -- 15 15 15     METs -- 2.5 2.5 2.6      T5 Nustep   Level -- -- 1 --    SPM -- -- 80 --    Minutes -- -- 15 --    METs -- -- 2 --      Biostep-RELP   Level -- 2 2 4     Minutes -- 15 15 15     METs -- 2 2 3       Oxygen   Maintain Oxygen Saturation -- 88% or higher 88% or higher 88% or higher       Exercise Comments:   Exercise Comments     Row Name 11/03/23 9170           Exercise Comments First full day of exercise!  Patient was oriented to gym and  equipment including functions, settings, policies, and procedures.  Patient's individual exercise prescription and treatment plan were reviewed.  All starting workloads were established based on the results of the 6 minute walk test done at initial orientation visit.  The plan for exercise progression was also introduced and progression will be customized based on patient's performance and goals.          Exercise Goals and Review:   Exercise Goals     Row Name 11/01/23 1110             Exercise Goals   Increase Physical Activity Yes       Intervention Provide advice, education, support and counseling about physical activity/exercise needs.;Develop an individualized exercise prescription for aerobic and resistive training based on initial evaluation findings, risk stratification, comorbidities and participant's personal goals.       Expected Outcomes Short Term: Attend rehab on a regular basis to increase amount of physical activity.;Long Term: Add in home exercise to make exercise part of routine and to increase amount of physical activity.;Long Term: Exercising regularly at least 3-5 days a week.       Increase Strength and Stamina Yes       Intervention Provide advice, education, support and counseling about physical activity/exercise needs.;Develop an individualized exercise prescription for aerobic and resistive training based on initial evaluation findings, risk stratification, comorbidities and participant's personal goals.       Expected Outcomes Short Term: Increase workloads from initial exercise prescription for resistance, speed, and METs.;Short Term: Perform resistance training exercises routinely during rehab and add in resistance training at home;Long Term: Improve cardiorespiratory fitness, muscular endurance and strength as measured by increased METs and functional capacity ( )       Able to understand and use rate of perceived exertion (RPE) scale Yes       Intervention  Provide education and explanation on how to use RPE scale       Expected Outcomes Short Term: Able to use RPE daily in rehab to express subjective intensity level;Long Term:  Able to use RPE to guide intensity level when exercising independently       Able to understand and use Dyspnea scale Yes       Intervention Provide education and explanation on how to use Dyspnea scale       Expected Outcomes Short Term: Able to use Dyspnea scale daily in rehab to express subjective sense of shortness of breath during exertion;Long Term: Able to use Dyspnea scale to guide intensity level when exercising independently       Knowledge and understanding of Target Heart Rate  Range (THRR) Yes       Intervention Provide education and explanation of THRR including how the numbers were predicted and where they are located for reference       Expected Outcomes Short Term: Able to state/look up THRR;Long Term: Able to use THRR to govern intensity when exercising independently;Short Term: Able to use daily as guideline for intensity in rehab       Able to check pulse independently Yes       Intervention Provide education and demonstration on how to check pulse in carotid and radial arteries.;Review the importance of being able to check your own pulse for safety during independent exercise       Expected Outcomes Short Term: Able to explain why pulse checking is important during independent exercise;Long Term: Able to check pulse independently and accurately       Understanding of Exercise Prescription Yes       Intervention Provide education, explanation, and written materials on patient's individual exercise prescription       Expected Outcomes Short Term: Able to explain program exercise prescription;Long Term: Able to explain home exercise prescription to exercise independently          Exercise Goals Re-Evaluation :  Exercise Goals Re-Evaluation     Row Name 11/03/23 0830 11/08/23 1413 11/24/23 1142 12/07/23 1455        Exercise Goal Re-Evaluation   Exercise Goals Review Able to understand and use rate of perceived exertion (RPE) scale;Able to understand and use Dyspnea scale;Knowledge and understanding of Target Heart Rate Range (THRR);Understanding of Exercise Prescription Increase Strength and Stamina;Increase Physical Activity;Understanding of Exercise Prescription Increase Strength and Stamina;Increase Physical Activity;Understanding of Exercise Prescription Increase Strength and Stamina;Increase Physical Activity;Understanding of Exercise Prescription    Comments Reviewed RPE and dyspnea scale, THR and program prescription with pt today.  Pt voiced understanding and was given a copy of goals to take home. Dana Irwin is off to a good start in the program. She was able to attend her first 2 sessions during this review period. During the 2 sessions she was able to use the treadmill at a workload of 2. and 0.5%, and the T4 nustep at level 2. We will continue to monitor her progress in the program. Dana Irwin is off to a good start in the program. She maintained her workload on the treadmill at a speed of 2.4 mph with 0.5% incline. She also maintained level 2 on the T4 and biostep. She added the recumbent bike to her prescription at level 4 and the T5 nustep at level 1. We will continue to monitor her progress in the program. Dana Irwin is doing well in rehab. She increased her incline on the treadmill to 3% while maintaining a speed of 2.4 mph. She also increased to level 4 on the T4 nustep and biostep. We will continue to monitor her progress in the program.    Expected Outcomes Short: Use RPE daily to regulate intensity. Long: Follow program prescription in THR. Short: Continue to follow exercise prescription. Long: Continue exercise to improve strength and stamina. Short: Try level 3 on the T4 nustep. Long: Continue exercise to improve strength and stamina. Short: Continue to progressively increase treadmill workload. Long:  Continue exercise to improve strength and stamina.       Discharge Exercise Prescription (Final Exercise Prescription Changes):  Exercise Prescription Changes - 12/07/23 1400       Response to Exercise   Blood Pressure (Admit) 114/68    Blood Pressure (Exercise)  170/80    Blood Pressure (Exit) 138/82    Heart Rate (Admit) 86 bpm    Heart Rate (Exercise) 130 bpm    Heart Rate (Exit) 93 bpm    Rating of Perceived Exertion (Exercise) 14    Symptoms none    Duration Continue with 30 min of aerobic exercise without signs/symptoms of physical distress.    Intensity THRR unchanged      Progression   Progression Continue to progress workloads to maintain intensity without signs/symptoms of physical distress.    Average METs 3.24      Resistance Training   Training Prescription Yes    Weight 3lb    Reps 10-15      Interval Training   Interval Training No      Treadmill   MPH 2.4    Grade 3    Minutes 15    METs 3.83      NuStep   Level 4    Minutes 15    METs 2.6      Biostep-RELP   Level 4    Minutes 15    METs 3      Oxygen   Maintain Oxygen Saturation 88% or higher          Nutrition:  Target Goals: Understanding of nutrition guidelines, daily intake of sodium 1500mg , cholesterol 200mg , calories 30% from fat and 7% or less from saturated fats, daily to have 5 or more servings of fruits and vegetables.  Education: All About Nutrition: -Group instruction provided by verbal, written material, interactive activities, discussions, models, and posters to present general guidelines for heart healthy nutrition including fat, fiber, MyPlate, the role of sodium in heart healthy nutrition, utilization of the nutrition label, and utilization of this knowledge for meal planning. Follow up email sent as well. Written material given at graduation. Flowsheet Row Cardiac Rehab from 12/08/2023 in [facility] Cardiac and Pulmonary Rehab  Education need identified 11/01/23  Date  11/24/23  Educator jg  Instruction Review Code 1- Verbalizes Understanding    Biometrics:  Pre Biometrics - 11/01/23 1111       Pre Biometrics   Height 5' 3.25 (1.607 m)    Weight 237 lb 14.4 oz (107.9 kg)    Waist Circumference 44.5 inches    Hip Circumference 51.5 inches    Waist to Hip Ratio 0.86 %    BMI (Calculated) 41.79    Single Leg Stand 20 seconds           Nutrition Therapy Plan and Nutrition Goals:  Nutrition Therapy & Goals - 11/01/23 1029       Nutrition Therapy   RD appointment deferred Yes      Intervention Plan   Intervention Prescribe, educate and counsel regarding individualized specific dietary modifications aiming towards targeted core components such as weight, hypertension, lipid management, diabetes, heart failure and other comorbidities.    Expected Outcomes Long Term Goal: Adherence to prescribed nutrition plan.;Short Term Goal: A plan has been developed with personal nutrition goals set during dietitian appointment.;Short Term Goal: Understand basic principles of dietary content, such as calories, fat, sodium, cholesterol and nutrients.          Nutrition Assessments:  MEDIFICTS Score Key: >=70 Need to make dietary changes  40-70 Heart Healthy Diet <= 40 Therapeutic Level Cholesterol Diet  Flowsheet Row Cardiac Rehab from 11/01/2023 in [facility] Cardiac and Pulmonary Rehab  Picture Your Plate Total Score on Admission 74   Picture Your Plate Scores: <59 Unhealthy dietary  pattern with much room for improvement. 41-50 Dietary pattern unlikely to meet recommendations for good health and room for improvement. 51-60 More healthful dietary pattern, with some room for improvement.  >60 Healthy dietary pattern, although there may be some specific behaviors that could be improved.    Nutrition Goals Re-Evaluation:  Nutrition Goals Re-Evaluation     Row Name 12/13/23 1721             Goals   Current Weight 236 lb 8 oz (107.3 kg)        Comment She would like to meet with the RD to learn more about portion control.       Expected Outcome Short: Meet with the RD. Long: Continue to try to manage diet.          Nutrition Goals Discharge (Final Nutrition Goals Re-Evaluation):  Nutrition Goals Re-Evaluation - 12/13/23 1721       Goals   Current Weight 236 lb 8 oz (107.3 kg)    Comment She would like to meet with the RD to learn more about portion control.    Expected Outcome Short: Meet with the RD. Long: Continue to try to manage diet.          Psychosocial: Target Goals: Acknowledge presence or absence of significant depression and/or stress, maximize coping skills, provide positive support system. Participant is able to verbalize types and ability to use techniques and skills needed for reducing stress and depression.   Education: Stress, Anxiety, and Depression - Group verbal and visual presentation to define topics covered.  Reviews how body is impacted by stress, anxiety, and depression.  Also discusses healthy ways to reduce stress and to treat/manage anxiety and depression.  Written material given at graduation.   Education: Sleep Hygiene -Provides group verbal and written instruction about how sleep can affect your health.  Define sleep hygiene, discuss sleep cycles and impact of sleep habits. Review good sleep hygiene tips.    Initial Review & Psychosocial Screening:  Initial Psych Review & Screening - 10/25/23 1424       Initial Review   Current issues with None Identified      Family Dynamics   Good Support System? Yes    Comments She lives alone and has a good support system, Her children live close [place] and has three dogs. She states no issues with mental illness and does not take anything for her mood.      Barriers   Psychosocial barriers to participate in program There are no identifiable barriers or psychosocial needs.;The patient should benefit from training in stress management and relaxation.       Screening Interventions   Interventions Encouraged to exercise;To provide support and resources with identified psychosocial needs;Provide feedback about the scores to participant    Expected Outcomes Short Term goal: Utilizing psychosocial counselor, staff and physician to assist with identification of specific Stressors or current issues interfering with healing process. Setting desired goal for each stressor or current issue identified.;Long Term Goal: Stressors or current issues are controlled or eliminated.;Short Term goal: Identification and review with participant of any Quality of Life or Depression concerns found by scoring the questionnaire.;Long Term goal: The participant improves quality of Life and PHQ9 Scores as seen by post scores and/or verbalization of changes          Quality of Life Scores:   Quality of Life - 11/01/23 1028       Quality of Life   Select Quality of Life  Quality of Life Scores   Health/Function Pre 19 %    Socioeconomic Pre 25.63 %    Psych/Spiritual Pre 23.93 %    Family Pre 19.3 %    GLOBAL Pre 21.54 %         Scores of 19 and below usually indicate a poorer quality of life in these areas.  A difference of  2-3 points is a clinically meaningful difference.  A difference of 2-3 points in the total score of the Quality of Life Index has been associated with significant improvement in overall quality of life, self-image, physical symptoms, and general health in studies assessing change in quality of life.  PHQ-9: Review Flowsheet       11/01/2023  Depression screen PHQ 2/9  Decreased Interest 0  Down, Depressed, Hopeless 0  PHQ - 2 Score 0  Altered sleeping 1  Tired, decreased energy 1  Change in appetite 1  Feeling bad or failure about yourself  0  Trouble concentrating 0  Moving slowly or fidgety/restless 0  Suicidal thoughts 0  PHQ-9 Score 3  Difficult doing work/chores Not difficult at all   Interpretation of Total Score   Total Score Depression Severity:  1-4 = Minimal depression, 5-9 = Mild depression, 10-14 = Moderate depression, 15-19 = Moderately severe depression, 20-27 = Severe depression   Psychosocial Evaluation and Intervention:  Psychosocial Evaluation - 10/25/23 1443       Psychosocial Evaluation & Interventions   Interventions Encouraged to exercise with the program and follow exercise prescription;Relaxation education;Stress management education    Comments She lives alone and has a good support system, Her children live close [place] and has three dogs. She states no issues with mental illness and does not take anything for her mood.    Expected Outcomes Short: Start [facility] to help with mood. Long: Maintain a healthy mental state    Continue Psychosocial Services  Follow up required by staff          Psychosocial Re-Evaluation:  Psychosocial Re-Evaluation     Row Name 12/13/23 1716             Psychosocial Re-Evaluation   Comments Dana Irwin states that she is having a little bit of stress as of late. Her daughter recently had an accident with the weed-eater, and her grandchildren are living with her. She is not having sleep concerns at this time, but does say that she gets about 5 hours of sleep nightly.       Expected Outcomes Short: Continue to work with her daughter, and manage her stress in healthy ways. Long: Continue to exercise in order to relieve stress.       Interventions Encouraged to attend Cardiac Rehabilitation for the exercise       Continue Psychosocial Services  Follow up required by staff          Psychosocial Discharge (Final Psychosocial Re-Evaluation):  Psychosocial Re-Evaluation - 12/13/23 1716       Psychosocial Re-Evaluation   Comments Dana Irwin states that she is having a little bit of stress as of late. Her daughter recently had an accident with the weed-eater, and her grandchildren are living with her. She is not having sleep concerns at this time, but does say  that she gets about 5 hours of sleep nightly.    Expected Outcomes Short: Continue to work with her daughter, and manage her stress in healthy ways. Long: Continue to exercise in order to relieve stress.    Interventions Encouraged  to attend Cardiac Rehabilitation for the exercise    Continue Psychosocial Services  Follow up required by staff          Vocational Rehabilitation: Provide vocational rehab assistance to qualifying candidates.   Vocational Rehab Evaluation & Intervention:   Education: Education Goals: Education classes will be provided on a variety of topics geared toward better understanding of heart health and risk factor modification. Participant will state understanding/return demonstration of topics presented as noted by education test scores.  Learning Barriers/Preferences:  Learning Barriers/Preferences - 10/25/23 1424       Learning Barriers/Preferences   Learning Barriers None    Learning Preferences None          General Cardiac Education Topics:  AED/CPR: - Group verbal and written instruction with the use of models to demonstrate the basic use of the AED with the basic ABC's of resuscitation.   Anatomy and Cardiac Procedures: - Group verbal and visual presentation and models provide information about basic cardiac anatomy and function. Reviews the testing methods done to diagnose heart disease and the outcomes of the test results. Describes the treatment choices: Medical Management, Angioplasty, or Coronary Bypass Surgery for treating various heart conditions including Myocardial Infarction, Angina, Valve Disease, and Cardiac Arrhythmias.  Written material given at graduation. Flowsheet Row Cardiac Rehab from 12/08/2023 in [facility] Cardiac and Pulmonary Rehab  Education need identified 11/01/23  Date 12/08/23  Educator SB  Instruction Review Code 1- Verbalizes Understanding    Medication Safety: - Group verbal and visual instruction to review commonly  prescribed medications for heart and lung disease. Reviews the medication, class of the drug, and side effects. Includes the steps to properly store meds and maintain the prescription regimen.  Written material given at graduation.   Intimacy: - Group verbal instruction through game format to discuss how heart and lung disease can affect sexual intimacy. Written material given at graduation..   Know Your Numbers and Heart Failure: - Group verbal and visual instruction to discuss disease risk factors for cardiac and pulmonary disease and treatment options.  Reviews associated critical values for Overweight/Obesity, Hypertension, Cholesterol, and Diabetes.  Discusses basics of heart failure: signs/symptoms and treatments.  Introduces Heart Failure Zone chart for action plan for heart failure.  Written material given at graduation. Flowsheet Row Cardiac Rehab from 12/08/2023 in [facility] Cardiac and Pulmonary Rehab  Education need identified 11/01/23    Infection Prevention: - Provides verbal and written material to individual with discussion of infection control including proper hand washing and proper equipment cleaning during exercise session. Flowsheet Row Cardiac Rehab from 12/08/2023 in [facility] Cardiac and Pulmonary Rehab  Date 10/25/23  Educator jh  Instruction Review Code 1- Verbalizes Understanding    Falls Prevention: - Provides verbal and written material to individual with discussion of falls prevention and safety. Flowsheet Row Cardiac Rehab from 12/08/2023 in [facility] Cardiac and Pulmonary Rehab  Date 10/25/23  Educator jh  Instruction Review Code 1- Verbalizes Understanding    Other: -Provides group and verbal instruction on various topics (see comments)   Knowledge Questionnaire Score:  Knowledge Questionnaire Score - 11/01/23 1027       Knowledge Questionnaire Score   Pre Score 18/26          Core Components/Risk Factors/Patient Goals at Admission:  Personal Goals and Risk  Factors at Admission - 10/25/23 1424       Core Components/Risk Factors/Patient Goals on Admission    Weight Management Yes;Weight Loss    Intervention Weight Management:  Provide education and appropriate resources to help participant work on and attain dietary goals.;Weight Management: Develop a combined nutrition and exercise program designed to reach desired caloric intake, while maintaining appropriate intake of nutrient and fiber, sodium and fats, and appropriate energy expenditure required for the weight goal.;Weight Management/Obesity: Establish reasonable short term and long term weight goals.;Obesity: Provide education and appropriate resources to help participant work on and attain dietary goals.    Expected Outcomes Short Term: Continue to assess and modify interventions until short term weight is achieved;Understanding recommendations for meals to include 15-35% energy as protein, 25-35% energy from fat, 35-60% energy from carbohydrates, less than 200mg  of dietary cholesterol, 20-35 gm of total fiber daily;Understanding of distribution of calorie intake throughout the day with the consumption of 4-5 meals/snacks;Weight Loss: Understanding of general recommendations for a balanced deficit meal plan, which promotes 1-2 lb weight loss per week and includes a negative energy balance of [phone] kcal/d    Hypertension Yes    Intervention Provide education on lifestyle modifcations including regular physical activity/exercise, weight management, moderate sodium restriction and increased consumption of fresh fruit, vegetables, and low fat dairy, alcohol moderation, and smoking cessation.;Monitor prescription use compliance.    Expected Outcomes Short Term: Continued assessment and intervention until BP is < 140/52mm HG in hypertensive participants. < 130/52mm HG in hypertensive participants with diabetes, heart failure or chronic kidney disease.;Long Term: Maintenance of blood pressure at goal levels.     Lipids Yes    Intervention Provide education and support for participant on nutrition & aerobic/resistive exercise along with prescribed medications to achieve LDL 70mg , HDL >40mg .    Expected Outcomes Short Term: Participant states understanding of desired cholesterol values and is compliant with medications prescribed. Participant is following exercise prescription and nutrition guidelines.;Long Term: Cholesterol controlled with medications as prescribed, with individualized exercise RX and with personalized nutrition plan. Value goals: LDL < 70mg , HDL > 40 mg.          Education:Diabetes - Individual verbal and written instruction to review signs/symptoms of diabetes, desired ranges of glucose level fasting, after meals and with exercise. Acknowledge that pre and post exercise glucose checks will be done for 3 sessions at entry of program.   Core Components/Risk Factors/Patient Goals Review:   Goals and Risk Factor Review     Row Name 12/13/23 1723             Core Components/Risk Factors/Patient Goals Review   Personal Goals Review Weight Management/Obesity;Hypertension       Review Dana Irwin is still trying to lose weight. She states her long term weight goal is 120lb. She is currently exercising in the program and at a gym on her off days. Dana Irwin is still taking her BP at least once a day. She states that she tries to take it every morning, and after dinner.       Expected Outcomes Short: Continue to take BP and manage diet. Long: Talk to the RD, continue to manage hypertension through exercise and diet.          Core Components/Risk Factors/Patient Goals at Discharge (Final Review):   Goals and Risk Factor Review - 12/13/23 1723       Core Components/Risk Factors/Patient Goals Review   Personal Goals Review Weight Management/Obesity;Hypertension    Review Dana Irwin is still trying to lose weight. She states her long term weight goal is 120lb. She is currently exercising in the program  and at a gym on her off days. Dana Irwin is  still taking her BP at least once a day. She states that she tries to take it every morning, and after dinner.    Expected Outcomes Short: Continue to take BP and manage diet. Long: Talk to the RD, continue to manage hypertension through exercise and diet.          ITP Comments:  ITP Comments     Row Name 10/25/23 1423 11/01/23 1026 11/03/23 0829 11/24/23 1013 12/22/23 1200   ITP Comments Virtual Visit completed. Patient informed on EP and RD appointment and 6 Minute walk test. Patient also informed of patient health questionnaires on My Chart. Patient Verbalizes understanding. Visit diagnosis can be found in [facility] 08/24/2023. Completed and gym orientation for cardiac rehab. Initial ITP created and sent for review to Dr. Oneil Pinal, Medical Director. First full day of exercise!  Patient was oriented to gym and equipment including functions, settings, policies, and procedures.  Patient's individual exercise prescription and treatment plan were reviewed.  All starting workloads were established based on the results of the 6 minute walk test done at initial orientation visit.  The plan for exercise progression was also introduced and progression will be customized based on patient's performance and goals. 30 Day review completed. Medical Director ITP review done, changes made as directed, and signed approval by Medical Director.    new toprgram 30 Day review completed. Medical Director ITP review done, changes made as directed, and signed approval by Medical Director.      Comments: 30 day review

## 2023-12-22 NOTE — Progress Notes (Signed)
 Daily Session Note  Patient Details  Name: Annalis Kaczmarczyk MRN: [id number] Date of Birth: Jul 08, 1955 Referring Provider:   Flowsheet Row Cardiac Rehab from 11/01/2023 in [facility] Cardiac and Pulmonary Rehab  Referring Provider Dr. Margery Ruth, MD    Encounter Date: 12/22/2023  Check In:  Session Check In - 12/22/23 0733       Check-In   Supervising physician immediately available to respond to emergencies See telemetry face sheet for immediately available ER MD    Location [facility]-Cardiac & Pulmonary Rehab    Staff Present Burnard Davenport RN,BSN,MPA;Maxon Conetta BS, Exercise Physiologist;Joseph Rolinda NORWOOD HARMAN Hobert Best, MS, Exercise Physiologist    Virtual Visit No    Medication changes reported     No    Fall or balance concerns reported    No    Warm-up and Cool-down Performed on first and last piece of equipment    Resistance Training Performed Yes    VAD Patient? No    PAD/SET Patient? No      Pain Assessment   Currently in Pain? No/denies             Social History   Tobacco Use  Smoking Status Never  Smokeless Tobacco Not on file    Goals Met:  Independence with exercise equipment Exercise tolerated well No report of concerns or symptoms today Strength training completed today  Goals Unmet:  Not Applicable  Comments: Pt able to follow exercise prescription today without complaint.  Will continue to monitor for progression.    Dr. Oneil Pinal is Medical Director for [facility] Cardiac Rehabilitation.  Dr. Fuad Aleskerov is Medical Director for [facility] Pulmonary Rehabilitation.

## 2023-12-27 ENCOUNTER — Encounter

## 2023-12-27 DIAGNOSIS — Z955 Presence of coronary angioplasty implant and graft: Secondary | ICD-10-CM

## 2023-12-27 NOTE — Progress Notes (Signed)
 Daily Session Note  Patient Details  Name: Dana Irwin MRN: [id number] Date of Birth: [birth year] Referring Provider:   Flowsheet Row Cardiac Rehab from 11/01/2023 in [facility] Cardiac and Pulmonary Rehab  Referring Provider Dr. Margery Ruth, MD    Encounter Date: 12/27/2023  Check In:  Session Check In - 12/27/23 0722       Check-In   Supervising physician immediately available to respond to emergencies See telemetry face sheet for immediately available ER MD    Location [facility]-Cardiac & Pulmonary Rehab    Staff Present Burnard Davenport RN,BSN,MPA;Joseph [facility] Dyane BS, ACSM CEP, Exercise Physiologist;Jason Elnor RDN,LDN    Virtual Visit No    Medication changes reported     No    Fall or balance concerns reported    No    Warm-up and Cool-down Performed on first and last piece of equipment    Resistance Training Performed Yes    VAD Patient? No    PAD/SET Patient? No      Pain Assessment   Currently in Pain? No/denies             Social History   Tobacco Use  Smoking Status Never  Smokeless Tobacco Not on file    Goals Met:  Independence with exercise equipment Exercise tolerated well No report of concerns or symptoms today Strength training completed today  Goals Unmet:  Not Applicable  Comments: Pt able to follow exercise prescription today without complaint.  Will continue to monitor for progression.    Dr. Oneil Pinal is Medical Director for [facility] Cardiac Rehabilitation.  Dr. Fuad Aleskerov is Medical Director for [facility] Pulmonary Rehabilitation.

## 2023-12-29 ENCOUNTER — Encounter

## 2023-12-31 ENCOUNTER — Encounter: Admitting: *Deleted

## 2023-12-31 DIAGNOSIS — Z955 Presence of coronary angioplasty implant and graft: Secondary | ICD-10-CM | POA: Diagnosis not present

## 2023-12-31 NOTE — Progress Notes (Signed)
 Daily Session Note  Patient Details  Name: Dana Irwin MRN: [id number] Date of Birth: 17-Jan-1956 Referring Provider:   Flowsheet Row Cardiac Rehab from 11/01/2023 in [facility] Cardiac and Pulmonary Rehab  Referring Provider Dr. Margery Ruth, MD    Encounter Date: 12/31/2023  Check In:  Session Check In - 12/31/23 0748       Check-In   Supervising physician immediately available to respond to emergencies See telemetry face sheet for immediately available ER MD    Location [facility]-Cardiac & Pulmonary Rehab    Staff Present Othel Durand, RN, BSN, CCRP;Maxon Conetta BS, Exercise Physiologist;Joseph Hood RCP,RRT,BSRT    Virtual Visit No    Medication changes reported     No    Fall or balance concerns reported    No    Warm-up and Cool-down Performed on first and last piece of equipment    Resistance Training Performed Yes    VAD Patient? No    PAD/SET Patient? No      Pain Assessment   Currently in Pain? No/denies             Social History   Tobacco Use  Smoking Status Never  Smokeless Tobacco Not on file    Goals Met:  Independence with exercise equipment Exercise tolerated well No report of concerns or symptoms today  Goals Unmet:  Not Applicable  Comments: Pt able to follow exercise prescription today without complaint.  Will continue to monitor for progression.    Dr. Oneil Pinal is Medical Director for [facility] Cardiac Rehabilitation.  Dr. Fuad Aleskerov is Medical Director for [facility] Pulmonary Rehabilitation.

## 2024-01-03 ENCOUNTER — Encounter

## 2024-01-03 DIAGNOSIS — Z955 Presence of coronary angioplasty implant and graft: Secondary | ICD-10-CM | POA: Diagnosis not present

## 2024-01-03 NOTE — Progress Notes (Signed)
 Daily Session Note  Patient Details  Name: Dana Irwin MRN: [id number] Date of Birth: [birth year] Referring Provider:   Flowsheet Row Cardiac Rehab from 11/01/2023 in [facility] Cardiac and Pulmonary Rehab  Referring Provider Dr. Margery Ruth, MD    Encounter Date: 01/03/2024  Check In:  Session Check In - 01/03/24 0716       Check-In   Supervising physician immediately available to respond to emergencies See telemetry face sheet for immediately available ER MD    Location [facility]-Cardiac & Pulmonary Rehab    Staff Present Burnard Davenport RN,BSN,MPA;Joseph Rolinda RCP,RRT,BSRT;Kristen Coble RN,BC,MSN;Jason Elnor [facility]    Virtual Visit No    Medication changes reported     No    Fall or balance concerns reported    No    Warm-up and Cool-down Performed on first and last piece of equipment    Resistance Training Performed Yes    VAD Patient? No    PAD/SET Patient? No      Pain Assessment   Currently in Pain? No/denies             Social History   Tobacco Use  Smoking Status Never  Smokeless Tobacco Not on file    Goals Met:  Independence with exercise equipment Exercise tolerated well No report of concerns or symptoms today Strength training completed today  Goals Unmet:  Not Applicable  Comments: Pt able to follow exercise prescription today without complaint.  Will continue to monitor for progression.    Dr. Oneil Pinal is Medical Director for [facility] Cardiac Rehabilitation.  Dr. Fuad Aleskerov is Medical Director for [facility] Pulmonary Rehabilitation.

## 2024-01-05 ENCOUNTER — Encounter

## 2024-01-05 DIAGNOSIS — Z955 Presence of coronary angioplasty implant and graft: Secondary | ICD-10-CM | POA: Diagnosis not present

## 2024-01-05 NOTE — Progress Notes (Signed)
 Daily Session Note  Patient Details  Name: Dana Irwin MRN: [id number] Date of Birth: [birth year] Referring Provider:   Flowsheet Row Cardiac Rehab from 11/01/2023 in [facility] Cardiac and Pulmonary Rehab  Referring Provider Dr. Margery Ruth, MD    Encounter Date: 01/05/2024  Check In:  Session Check In - 01/05/24 0736       Check-In   Supervising physician immediately available to respond to emergencies See telemetry face sheet for immediately available ER MD    Location [facility]-Cardiac & Pulmonary Rehab    Staff Present Burnard Davenport RN,BSN,MPA;Maxon Conetta BS, Exercise Physiologist;Joseph Rolinda RCP,RRT,BSRT;Margaret Best, MS, Exercise Physiologist;Jason Elnor RDN,LDN    Virtual Visit No    Medication changes reported     No    Fall or balance concerns reported    No    Warm-up and Cool-down Performed on first and last piece of equipment    Resistance Training Performed Yes    VAD Patient? No    PAD/SET Patient? No      Pain Assessment   Currently in Pain? No/denies             Social History   Tobacco Use  Smoking Status Never  Smokeless Tobacco Not on file    Goals Met:  Independence with exercise equipment Exercise tolerated well No report of concerns or symptoms today Strength training completed today  Goals Unmet:  Not Applicable  Comments: Pt able to follow exercise prescription today without complaint.  Will continue to monitor for progression.    Dr. Oneil Pinal is Medical Director for [facility] Cardiac Rehabilitation.  Dr. Fuad Aleskerov is Medical Director for [facility] Pulmonary Rehabilitation.

## 2024-01-07 ENCOUNTER — Encounter

## 2024-01-07 DIAGNOSIS — Z955 Presence of coronary angioplasty implant and graft: Secondary | ICD-10-CM | POA: Diagnosis not present

## 2024-01-07 NOTE — Progress Notes (Signed)
 Daily Session Note  Patient Details  Name: Dana Irwin MRN: [id number] Date of Birth: 05-31-1956 Referring Provider:   Flowsheet Row Cardiac Rehab from 11/01/2023 in [facility] Cardiac and Pulmonary Rehab  Referring Provider Dr. Margery Ruth, MD    Encounter Date: 01/07/2024  Check In:  Session Check In - 01/07/24 0716       Check-In   Supervising physician immediately available to respond to emergencies See telemetry face sheet for immediately available ER MD    Location [facility]-Cardiac & Pulmonary Rehab    Staff Present Burnard Davenport RN,BSN,MPA;Joseph [facility] Dyane BS, ACSM CEP, Exercise Physiologist    Virtual Visit No    Medication changes reported     No    Fall or balance concerns reported    No    Warm-up and Cool-down Performed on first and last piece of equipment    Resistance Training Performed Yes    VAD Patient? No    PAD/SET Patient? No      Pain Assessment   Currently in Pain? No/denies             Social History   Tobacco Use  Smoking Status Never  Smokeless Tobacco Not on file    Goals Met:  Independence with exercise equipment Exercise tolerated well No report of concerns or symptoms today Strength training completed today  Goals Unmet:  Not Applicable  Comments: Pt able to follow exercise prescription today without complaint.  Will continue to monitor for progression.    Dr. Oneil Pinal is Medical Director for [facility] Cardiac Rehabilitation.  Dr. Fuad Aleskerov is Medical Director for [facility] Pulmonary Rehabilitation.

## 2024-01-10 ENCOUNTER — Encounter

## 2024-01-10 DIAGNOSIS — Z955 Presence of coronary angioplasty implant and graft: Secondary | ICD-10-CM

## 2024-01-10 NOTE — Progress Notes (Signed)
 Daily Session Note  Patient Details  Name: Dana Irwin MRN: [id number] Date of Birth: October 12, 1955 Referring Provider:   Flowsheet Row Cardiac Rehab from 11/01/2023 in [facility] Cardiac and Pulmonary Rehab  Referring Provider Dr. Margery Ruth, MD    Encounter Date: 01/10/2024  Check In:  Session Check In - 01/10/24 0747       Check-In   Supervising physician immediately available to respond to emergencies See telemetry face sheet for immediately available ER MD    Location [facility]-Cardiac & Pulmonary Rehab    Staff Present Burnard Davenport RN,BSN,MPA;Joseph [facility] Dyane BS, ACSM CEP, Exercise Physiologist;Jason Elnor RDN,LDN    Virtual Visit No    Medication changes reported     No    Fall or balance concerns reported    No    Warm-up and Cool-down Performed on first and last piece of equipment    Resistance Training Performed Yes    VAD Patient? No    PAD/SET Patient? No      Pain Assessment   Currently in Pain? No/denies             Social History   Tobacco Use  Smoking Status Never  Smokeless Tobacco Not on file    Goals Met:  Independence with exercise equipment Exercise tolerated well No report of concerns or symptoms today Strength training completed today  Goals Unmet:  Not Applicable  Comments: Pt able to follow exercise prescription today without complaint.  Will continue to monitor for progression.    Dr. Oneil Pinal is Medical Director for [facility] Cardiac Rehabilitation.  Dr. Fuad Aleskerov is Medical Director for [facility] Pulmonary Rehabilitation.

## 2024-01-12 ENCOUNTER — Encounter

## 2024-01-12 VITALS — Ht 63.25 in | Wt 235.0 lb

## 2024-01-12 DIAGNOSIS — Z955 Presence of coronary angioplasty implant and graft: Secondary | ICD-10-CM | POA: Diagnosis not present

## 2024-01-12 NOTE — Progress Notes (Signed)
 Daily Session Note  Patient Details  Name: Dana Irwin MRN: [id number] Date of Birth: [birth year] Referring Provider:   Flowsheet Row Cardiac Rehab from 11/01/2023 in [facility] Cardiac and Pulmonary Rehab  Referring Provider Dr. Margery Ruth, MD    Encounter Date: 01/12/2024  Check In:  Session Check In - 01/12/24 0805       Check-In   Supervising physician immediately available to respond to emergencies See telemetry face sheet for immediately available ER MD    Location [facility]-Cardiac & Pulmonary Rehab    Staff Present Burnard Davenport RN,BSN,MPA;Joseph [facility] RCP,RRT,BSRT;Margaret Best, MS, Exercise Physiologist;Jason Elnor RDN,LDN;Noah Tickle, BS, Exercise Physiologist    Virtual Visit No    Medication changes reported     No    Fall or balance concerns reported    No    Warm-up and Cool-down Performed on first and last piece of equipment    Resistance Training Performed Yes    VAD Patient? No    PAD/SET Patient? No      Pain Assessment   Currently in Pain? No/denies             Social History   Tobacco Use  Smoking Status Never  Smokeless Tobacco Not on file    Goals Met:  Independence with exercise equipment Exercise tolerated well No report of concerns or symptoms today Strength training completed today  Goals Unmet:  Not Applicable  Comments: Pt able to follow exercise prescription today without complaint.  Will continue to monitor for progression.    Dr. Oneil Pinal is Medical Director for [facility] Cardiac Rehabilitation.  Dr. Fuad Aleskerov is Medical Director for [facility] Pulmonary Rehabilitation.

## 2024-01-12 NOTE — Patient Instructions (Addendum)
 Discharge Patient Instructions  Patient Details  Name: Dana Irwin MRN: [id number] Date of Birth: 1955/07/01 Referring Provider:  Glover Lenis, MD   Number of Visits: 49  Reason for Discharge:  Patient reached a stable level of exercise. Patient independent in their exercise. Patient has met program and personal goals.  Diagnosis:  Status post coronary artery stent placement  Initial Exercise Prescription:  Initial Exercise Prescription - 11/01/23 1100       Date of Initial Exercise RX and Referring Provider   Date 11/01/23    Referring Provider Dr. Margery Ruth, MD      Oxygen   Maintain Oxygen Saturation 88% or higher      Treadmill   MPH 2.4    Grade 0.5    Minutes 15    METs 3      NuStep   Level 2    SPM 80    Minutes 15    METs 2.29      Biostep-RELP   Level 2    SPM 50    Minutes 15    METs 2.29      Prescription Details   Frequency (times per week) 3    Duration Progress to 30 minutes of continuous aerobic without signs/symptoms of physical distress      Intensity   THRR 40-80% of Max Heartrate 101-135    Ratings of Perceived Exertion 11-13    Perceived Dyspnea 0-4      Progression   Progression Continue to progress workloads to maintain intensity without signs/symptoms of physical distress.      Resistance Training   Training Prescription Yes    Weight 3 lb    Reps 10-15          Discharge Exercise Prescription (Final Exercise Prescription Changes):  Exercise Prescription Changes - 01/06/24 1100       Response to Exercise   Blood Pressure (Admit) 118/60    Blood Pressure (Exit) 112/66    Heart Rate (Admit) 81 bpm    Heart Rate (Exercise) 121 bpm    Heart Rate (Exit) 91 bpm    Oxygen Saturation (Admit) 96 %    Oxygen Saturation (Exercise) 93 %    Oxygen Saturation (Exit) 96 %    Rating of Perceived Exertion (Exercise) 14    Perceived Dyspnea (Exercise) 0    Symptoms none    Duration Continue with 30 min of aerobic exercise  without signs/symptoms of physical distress.    Intensity THRR unchanged      Progression   Progression Continue to progress workloads to maintain intensity without signs/symptoms of physical distress.    Average METs 3.92      Resistance Training   Training Prescription Yes    Weight 3lb    Reps 10-15      Interval Training   Interval Training No      Treadmill   MPH 2.8    Grade 4.5    Minutes 15    METs 4.88      Recumbant Bike   Level 3    Watts 25    Minutes 15    METs 2.73      NuStep   Level 4    Minutes 15    METs 3.2      Biostep-RELP   Level 4    Minutes 15    METs 3      Oxygen   Maintain Oxygen Saturation 88% or higher  Functional Capacity:  6 Minute Walk     Row Name 11/01/23 1034 01/12/24 0757       6 Minute Walk   Phase Initial Discharge    Distance 1245 feet 1530 feet    Distance % Change -- 22.89 %    Distance Feet Change -- 285 ft    Walk Time 6 minutes 6 minutes    # of Rest Breaks 0 0    MPH 2.36 2.9    METS 2.29 3.11    RPE 7 12    Perceived Dyspnea  0 0    VO2 Peak 8 10.89    Symptoms No No    Resting HR 67 bpm 88 bpm    Resting BP 132/76 120/70    Resting Oxygen Saturation  97 % 94 %    Exercise Oxygen Saturation  during 6 min walk 96 % 93 %    Max Ex. HR 102 bpm 117 bpm    Max Ex. BP 146/84 162/80    2 Minute Post BP 140/82 138/78      Nutrition & Weight - Outcomes:  Pre Biometrics - 11/01/23 1111       Pre Biometrics   Height 5' 3.25 (1.607 m)    Weight 237 lb 14.4 oz (107.9 kg)    Waist Circumference 44.5 inches    Hip Circumference 51.5 inches    Waist to Hip Ratio 0.86 %    BMI (Calculated) 41.79    Single Leg Stand 20 seconds          Post Biometrics - 01/12/24 0759        Post  Biometrics   Height 5' 3.25 (1.607 m)    Weight 235 lb (106.6 kg)    Waist Circumference 45.5 inches    Hip Circumference 50 inches    Waist to Hip Ratio 0.91 %    BMI (Calculated) 41.28    Single Leg Stand  8.3 seconds

## 2024-01-14 ENCOUNTER — Encounter

## 2024-01-14 DIAGNOSIS — Z955 Presence of coronary angioplasty implant and graft: Secondary | ICD-10-CM

## 2024-01-14 NOTE — Progress Notes (Signed)
 Daily Session Note  Patient Details  Name: Dana Irwin MRN: [id number] Date of Birth: [birth year] Referring Provider:   Flowsheet Row Cardiac Rehab from 11/01/2023 in [facility] Cardiac [facility] Rehab  Referring Provider Dr. Margery Ruth, MD    Encounter Date: 01/14/2024  Check In:  Session Check In - 01/14/24 0740       Check-In   Supervising physician immediately available to respond to emergencies See telemetry face sheet for immediately available ER MD    Location [facility]-Cardiac & Pulmonary Rehab    Staff Present Burnard Davenport RN,BSN,MPA;Joseph Rolinda RCP,RRT,BSRT;Noah Tickle, [place], Exercise Physiologist    Virtual Visit No    Medication changes reported     No    Fall or balance concerns reported    No    Warm-up and Cool-down Performed on first and last piece of equipment    Resistance Training Performed Yes    VAD Patient? No    PAD/SET Patient? No      Pain Assessment   Currently in Pain? No/denies             Social History   Tobacco Use  Smoking Status Never  Smokeless Tobacco Not on file    Goals Met:  Independence with exercise equipment Exercise tolerated well No report of concerns or symptoms today Strength training completed today  Goals Unmet:  Not Applicable  Comments: Pt able to follow exercise prescription today without complaint.  Will continue to monitor for progression.    Dr. Oneil Pinal is Medical Director for [facility] Cardiac Rehabilitation.  Dr. Fuad Aleskerov is Medical Director for [facility] Pulmonary Rehabilitation.

## 2024-01-17 ENCOUNTER — Encounter

## 2024-01-19 ENCOUNTER — Encounter

## 2024-01-19 DIAGNOSIS — Z955 Presence of coronary angioplasty implant and graft: Secondary | ICD-10-CM

## 2024-01-19 NOTE — Progress Notes (Signed)
 Daily Session Note  Patient Details  Name: Dana Irwin MRN: [id number] Date of Birth: [birth year] Referring Provider:   Flowsheet Row Cardiac Rehab from 11/01/2023 in [facility] Rehab  Referring Provider Dr. Margery Ruth, MD    Encounter Date: 01/19/2024  Check In:  Session Check In - 01/19/24 0741       Check-In   Supervising physician immediately available to respond to emergencies See telemetry face sheet for immediately available ER MD    Location [facility]-Cardiac & Pulmonary Rehab    Staff Present Burnard Davenport RN,BSN,MPA;Joseph [facility] RCP,RRT,BSRT;Margaret Best, MS, Exercise Physiologist;Jason Elnor RDN,LDN;Noah Tickle, BS, Exercise Physiologist    Virtual Visit No    Medication changes reported     Yes    Comments taking losartan 25mg ; cephalexin 500mg     Fall or balance concerns reported    No    Warm-up and Cool-down Performed on first and last piece of equipment    Resistance Training Performed Yes    VAD Patient? No    PAD/SET Patient? No      Pain Assessment   Currently in Pain? No/denies             Social History   Tobacco Use  Smoking Status Never  Smokeless Tobacco Not on file    Goals Met:  Independence with exercise equipment Exercise tolerated well No report of concerns or symptoms today Strength training completed today  Goals Unmet:  Not Applicable  Comments: Pt able to follow exercise prescription today without complaint.  Will continue to monitor for progression.    Dr. Oneil Pinal is Medical Director for [facility] Cardiac Rehabilitation.  Dr. Fuad Aleskerov is Medical Director for [facility] Pulmonary Rehabilitation.

## 2024-01-19 NOTE — Progress Notes (Signed)
 Cardiac Individual Treatment Plan  Patient Details  Name: Dana Irwin MRN: [id number] Date of Birth: 04-07-1956 Referring Provider:   Flowsheet Row Cardiac Rehab from 11/01/2023 in [facility] Cardiac and Pulmonary Rehab  Referring Provider Dr. Margery Ruth, MD    Initial Encounter Date:  Flowsheet Row Cardiac Rehab from 11/01/2023 in [facility] Cardiac and Pulmonary Rehab  Date 11/01/23    Visit Diagnosis: Status post coronary artery stent placement  Patient's Home Medications on Admission:  Current Outpatient Medications:    acetaminophen (TYLENOL) 325 MG tablet, Take 650 mg by mouth., Disp: , Rfl:    acetaminophen (TYLENOL) 650 MG CR tablet, Take 650 mg by mouth every 8 (eight) hours as needed for pain., Disp: , Rfl:    albuterol (VENTOLIN HFA) 108 (90 Base) MCG/ACT inhaler, Inhale 2 puffs into the lungs., Disp: , Rfl:    aspirin EC 81 MG tablet, Take 81 mg by mouth., Disp: , Rfl:    cefdinir (OMNICEF) 300 MG capsule, Take 300 mg by mouth 2 (two) times daily. (Patient not taking: Reported on 10/25/2023), Disp: , Rfl:    cetirizine (ZYRTEC) 10 MG chewable tablet, Chew 10 mg by mouth daily. As needed (Patient not taking: Reported on 10/25/2023), Disp: , Rfl:    cetirizine (ZYRTEC) 10 MG tablet, Take 10 mg by mouth. (Patient not taking: Reported on 10/25/2023), Disp: , Rfl:    clopidogrel (PLAVIX) 75 MG tablet, Take 75 mg by mouth., Disp: , Rfl:    colchicine 0.6 MG tablet, Take 0.6 mg by mouth. (Patient not taking: Reported on 10/25/2023), Disp: , Rfl:    Digestive Enzymes (DIGESTIVE ENZYME PO), Take 1 tablet by mouth daily., Disp: , Rfl:    ibuprofen (ADVIL) 200 MG tablet, Take 200 mg by mouth every 6 (six) hours as needed for mild pain. As needed (Patient not taking: Reported on 10/25/2023), Disp: , Rfl:    ibuprofen (ADVIL) 600 MG tablet, TAKE 1 TABLET BY MOUTH EVERY 8 HOURS FOR 14 DAYS (Patient not taking: Reported on 10/25/2023), Disp: , Rfl:    Multiple Vitamin (MULTIVITAMIN) tablet, Take 1 tablet by mouth  daily., Disp: , Rfl:    rosuvastatin (CRESTOR) 20 MG tablet, Take 20 mg by mouth at bedtime. (Patient not taking: Reported on 10/25/2023), Disp: , Rfl:    rosuvastatin (CRESTOR) 40 MG tablet, Take 40 mg by mouth., Disp: , Rfl:    triamcinolone cream (KENALOG) 0.1 %, Apply 1 Application topically 2 (two) times daily. As needed (Patient not taking: Reported on 10/25/2023), Disp: , Rfl:    triamcinolone cream (KENALOG) 0.1 %, Apply 1 Application topically. (Patient not taking: Reported on 10/25/2023), Disp: , Rfl:   Past Medical History: Past Medical History:  Diagnosis Date   Abnormal Pap smear of cervix    Allergies    Asthma    Hyperlipidemia     Tobacco Use: Social History   Tobacco Use  Smoking Status Never  Smokeless Tobacco Not on file    Labs: Review Flowsheet        No data to display           Exercise Target Goals: Exercise Program Goal: Individual exercise prescription set using results from initial 6 min walk test and THRR while considering  patient's activity barriers and safety.   Exercise Prescription Goal: Initial exercise prescription builds to 30-45 minutes a day of aerobic activity, 2-3 days per week.  Home exercise guidelines will be given to patient during program as part of exercise prescription that the  participant will acknowledge.   Education: Aerobic Exercise: - Group verbal and visual presentation on the components of exercise prescription. Introduces F.I.T.T principle from ACSM for exercise prescriptions.  Reviews F.I.T.T. principles of aerobic exercise including progression. Written material given at graduation. Flowsheet Row Cardiac Rehab from 01/19/2024 in [facility] Cardiac and Pulmonary Rehab  Education need identified 11/01/23    Education: Resistance Exercise: - Group verbal and visual presentation on the components of exercise prescription. Introduces F.I.T.T principle from ACSM for exercise prescriptions  Reviews F.I.T.T. principles of resistance  exercise including progression. Written material given at graduation. Flowsheet Row Cardiac Rehab from 01/19/2024 in [facility] Cardiac and Pulmonary Rehab  Date 11/10/23  Educator nt  Instruction Review Code 1- TEFL teacher Understanding     Education: Exercise & Equipment Safety: - Individual verbal instruction and demonstration of equipment use and safety with use of the equipment. Flowsheet Row Cardiac Rehab from 01/19/2024 in [facility] Cardiac and Pulmonary Rehab  Date 10/25/23  Educator jh  Instruction Review Code 1- Verbalizes Understanding    Education: Exercise Physiology & General Exercise Guidelines: - Group verbal and written instruction with models to review the exercise physiology of the cardiovascular system and associated critical values. Provides general exercise guidelines with specific guidelines to those with heart or lung disease.  Flowsheet Row Cardiac Rehab from 01/19/2024 in [facility] Cardiac and Pulmonary Rehab  Date 01/12/24  Educator kh  Instruction Review Code 1- [employer] Understanding    Education: Flexibility, Balance, Mind/Body Relaxation: - Group verbal and visual presentation with interactive activity on the components of exercise prescription. Introduces F.I.T.T principle from ACSM for exercise prescriptions. Reviews F.I.T.T. principles of flexibility and balance exercise training including progression. Also discusses the mind body connection.  Reviews various relaxation techniques to help reduce and manage stress (i.e. Deep breathing, progressive muscle relaxation, and visualization). Balance handout provided to take home. Written material given at graduation. Flowsheet Row Cardiac Rehab from 01/19/2024 in [facility] Cardiac and Pulmonary Rehab  Date 11/10/23  Educator NT  Instruction Review Code 1- Verbalizes Understanding    Activity Barriers & Risk Stratification:  Activity Barriers & Cardiac Risk Stratification - 11/01/23 1110       Activity Barriers & Cardiac Risk  Stratification   Activity Barriers Arthritis;Other (comment)    Comments Hx L foot injury, arthritis in knees    Cardiac Risk Stratification Moderate          6 Minute Walk:  6 Minute Walk     Row Name 11/01/23 1034 01/12/24 0757       6 Minute Walk   Phase Initial Discharge    Distance 1245 feet 1530 feet    Distance % Change -- 22.89 %    Distance Feet Change -- 285 ft    Walk Time 6 minutes 6 minutes    # of Rest Breaks 0 0    MPH 2.36 2.9    METS 2.29 3.11    RPE 7 12    Perceived Dyspnea  0 0    VO2 Peak 8 10.89    Symptoms No No    Resting HR 67 bpm 88 bpm    Resting BP 132/76 120/70    Resting Oxygen Saturation  97 % 94 %    Exercise Oxygen Saturation  during 6 min walk 96 % 93 %    Max Ex. HR 102 bpm 117 bpm    Max Ex. BP 146/84 162/80    2 Minute Post BP 140/82 138/78  Oxygen Initial Assessment:   Oxygen Re-Evaluation:   Oxygen Discharge (Final Oxygen Re-Evaluation):   Initial Exercise Prescription:  Initial Exercise Prescription - 11/01/23 1100       Date of Initial Exercise RX and Referring Provider   Date 11/01/23    Referring Provider Dr. Margery Ruth, MD      Oxygen   Maintain Oxygen Saturation 88% or higher      Treadmill   MPH 2.4    Grade 0.5    Minutes 15    METs 3      NuStep   Level 2    SPM 80    Minutes 15    METs 2.29      Biostep-RELP   Level 2    SPM 50    Minutes 15    METs 2.29      Prescription Details   Frequency (times per week) 3    Duration Progress to 30 minutes of continuous aerobic without signs/symptoms of physical distress      Intensity   THRR 40-80% of Max Heartrate 101-135    Ratings of Perceived Exertion 11-13    Perceived Dyspnea 0-4      Progression   Progression Continue to progress workloads to maintain intensity without signs/symptoms of physical distress.      Resistance Training   Training Prescription Yes    Weight 3 lb    Reps 10-15          Perform Capillary Blood  Glucose checks as needed.  Exercise Prescription Changes:   Exercise Prescription Changes     Row Name 11/01/23 1100 11/08/23 1400 11/24/23 1100 12/07/23 1400 12/22/23 1400     Response to Exercise   Blood Pressure (Admit) 132/76 124/60 120/68 114/68 138/76   Blood Pressure (Exercise) 146/84 124/70 132/74 170/80 --   Blood Pressure (Exit) 140/82 118/70 122/76 138/82 112/74   Heart Rate (Admit) 67 bpm 76 bpm 72 bpm 86 bpm 73 bpm   Heart Rate (Exercise) 102 bpm 120 bpm 117 bpm 130 bpm 124 bpm   Heart Rate (Exit) 74 bpm 84 bpm 79 bpm 93 bpm 89 bpm   Oxygen Saturation (Admit) 97 % -- -- -- 94 %   Oxygen Saturation (Exercise) 96 % -- -- -- 91 %   Oxygen Saturation (Exit) -- -- -- -- 94 %   Rating of Perceived Exertion (Exercise) 7 12 13 14 13    Perceived Dyspnea (Exercise) 0 0 -- -- --   Symptoms none none none none none   Comments Results First 2 weeks of exercise -- -- --   Duration -- Progress to 30 minutes of  aerobic without signs/symptoms of physical distress Continue with 30 min of aerobic exercise without signs/symptoms of physical distress. Continue with 30 min of aerobic exercise without signs/symptoms of physical distress. Continue with 30 min of aerobic exercise without signs/symptoms of physical distress.   Intensity -- THRR unchanged THRR unchanged THRR unchanged THRR unchanged     Progression   Progression -- Continue to progress workloads to maintain intensity without signs/symptoms of physical distress. Continue to progress workloads to maintain intensity without signs/symptoms of physical distress. Continue to progress workloads to maintain intensity without signs/symptoms of physical distress. Continue to progress workloads to maintain intensity without signs/symptoms of physical distress.   Average METs -- 2.6 2.56 3.24 3.67     Resistance Training   Training Prescription -- Yes Yes Yes Yes   Weight -- 3lb 3lb 3lb 3lb  Reps -- 10-15 10-15 10-15 10-15      Interval Training   Interval Training -- No No No No     Treadmill   MPH -- 2.4 2.4 2.4 2.5   Grade -- 0.5 0.5 3 4    Minutes -- 15 15 15 15    METs -- 3 3 3.83 4.29     Recumbant Bike   Level -- -- 4 -- --   RPM -- -- 50 -- --   Watts -- -- 25 -- --   Minutes -- -- 15 -- --   METs -- -- 2.73 -- --     NuStep   Level -- 2 2 4 4    Minutes -- 15 15 15 15    METs -- 2.5 2.5 2.6 3.2     T5 Nustep   Level -- -- 1 -- --   SPM -- -- 80 -- --   Minutes -- -- 15 -- --   METs -- -- 2 -- --     Biostep-RELP   Level -- 2 2 4 3    Minutes -- 15 15 15 15    METs -- 2 2 3 4      Oxygen   Maintain Oxygen Saturation -- 88% or higher 88% or higher 88% or higher 88% or higher    Row Name 01/06/24 1100 01/18/24 1500           Response to Exercise   Blood Pressure (Admit) 118/60 126/70      Blood Pressure (Exit) 112/66 118/66      Heart Rate (Admit) 81 bpm 75 bpm      Heart Rate (Exercise) 121 bpm 128 bpm      Heart Rate (Exit) 91 bpm 90 bpm      Oxygen Saturation (Admit) 96 % 96 %      Oxygen Saturation (Exercise) 93 % 91 %      Oxygen Saturation (Exit) 96 % 96 %      Rating of Perceived Exertion (Exercise) 14 13      Perceived Dyspnea (Exercise) 0 0      Symptoms none none      Duration Continue with 30 min of aerobic exercise without signs/symptoms of physical distress. Continue with 30 min of aerobic exercise without signs/symptoms of physical distress.      Intensity THRR unchanged THRR unchanged        Progression   Progression Continue to progress workloads to maintain intensity without signs/symptoms of physical distress. Continue to progress workloads to maintain intensity without signs/symptoms of physical distress.      Average METs 3.92 4.32        Resistance Training   Training Prescription Yes Yes      Weight 3lb 3lb      Reps 10-15 10-15        Interval Training   Interval Training No No        Treadmill   MPH 2.8 3      Grade 4.5 5      Minutes 15 15       METs 4.88 5.37        Recumbant Bike   Level 3 --      Watts 25 --      Minutes 15 --      METs 2.73 --        NuStep   Level 4 --      Minutes 15 --      METs 3.2 --  REL-XR   Level -- 4      Minutes -- 15        T5 Nustep   Level -- 3      Minutes -- 15      METs -- 2.6        Biostep-RELP   Level 4 3      Minutes 15 15      METs 3 3        Oxygen   Maintain Oxygen Saturation 88% or higher 88% or higher         Exercise Comments:   Exercise Comments     Row Name 11/03/23 9170           Exercise Comments First full day of exercise!  Patient was oriented to gym and equipment including functions, settings, policies, and procedures.  Patient's individual exercise prescription and treatment plan were reviewed.  All starting workloads were established based on the results of the 6 minute walk test done at initial orientation visit.  The plan for exercise progression was also introduced and progression will be customized based on patient's performance and goals.          Exercise Goals and Review:   Exercise Goals     Row Name 11/01/23 1110             Exercise Goals   Increase Physical Activity Yes       Intervention Provide advice, education, support and counseling about physical activity/exercise needs.;Develop an individualized exercise prescription for aerobic and resistive training based on initial evaluation findings, risk stratification, comorbidities and participant's personal goals.       Expected Outcomes Short Term: Attend rehab on a regular basis to increase amount of physical activity.;Long Term: Add in home exercise to make exercise part of routine and to increase amount of physical activity.;Long Term: Exercising regularly at least 3-5 days a week.       Increase Strength and Stamina Yes       Intervention Provide advice, education, support and counseling about physical activity/exercise needs.;Develop an individualized exercise  prescription for aerobic and resistive training based on initial evaluation findings, risk stratification, comorbidities and participant's personal goals.       Expected Outcomes Short Term: Increase workloads from initial exercise prescription for resistance, speed, and METs.;Short Term: Perform resistance training exercises routinely during rehab and add in resistance training at home;Long Term: Improve cardiorespiratory fitness, muscular endurance and strength as measured by increased METs and functional capacity ( )       Able to understand and use rate of perceived exertion (RPE) scale Yes       Intervention Provide education and explanation on how to use RPE scale       Expected Outcomes Short Term: Able to use RPE daily in rehab to express subjective intensity level;Long Term:  Able to use RPE to guide intensity level when exercising independently       Able to understand and use Dyspnea scale Yes       Intervention Provide education and explanation on how to use Dyspnea scale       Expected Outcomes Short Term: Able to use Dyspnea scale daily in rehab to express subjective sense of shortness of breath during exertion;Long Term: Able to use Dyspnea scale to guide intensity level when exercising independently       Knowledge and understanding of Target Heart Rate Range (THRR) Yes       Intervention Provide education and explanation  of THRR including how the numbers were predicted and where they are located for reference       Expected Outcomes Short Term: Able to state/look up THRR;Long Term: Able to use THRR to govern intensity when exercising independently;Short Term: Able to use daily as guideline for intensity in rehab       Able to check pulse independently Yes       Intervention Provide education and demonstration on how to check pulse in carotid and radial arteries.;Review the importance of being able to check your own pulse for safety during independent exercise       Expected Outcomes  Short Term: Able to explain why pulse checking is important during independent exercise;Long Term: Able to check pulse independently and accurately       Understanding of Exercise Prescription Yes       Intervention Provide education, explanation, and written materials on patient's individual exercise prescription       Expected Outcomes Short Term: Able to explain program exercise prescription;Long Term: Able to explain home exercise prescription to exercise independently          Exercise Goals Re-Evaluation :  Exercise Goals Re-Evaluation     Row Name 11/03/23 0830 11/08/23 1413 11/24/23 1142 12/07/23 1455 12/22/23 1405     Exercise Goal Re-Evaluation   Exercise Goals Review Able to understand and use rate of perceived exertion (RPE) scale;Able to understand and use Dyspnea scale;Knowledge and understanding of Target Heart Rate Range (THRR);Understanding of Exercise Prescription Increase Strength and Stamina;Increase Physical Activity;Understanding of Exercise Prescription Increase Strength and Stamina;Increase Physical Activity;Understanding of Exercise Prescription Increase Strength and Stamina;Increase Physical Activity;Understanding of Exercise Prescription Increase Strength and Stamina;Increase Physical Activity;Understanding of Exercise Prescription   Comments Reviewed RPE and dyspnea scale, THR and program prescription with pt today.  Pt voiced understanding and was given a copy of goals to take home. Xitlalic is off to a good start in the program. She was able to attend her first 2 sessions during this review period. During the 2 sessions she was able to use the treadmill at a workload of 2. and 0.5%, and the T4 nustep at level 2. We will continue to monitor her progress in the program. Dequita is off to a good start in the program. She maintained her workload on the treadmill at a speed of 2.4 mph with 0.5% incline. She also maintained level 2 on the T4 and biostep. She added the recumbent  bike to her prescription at level 4 and the T5 nustep at level 1. We will continue to monitor her progress in the program. Beverley is doing well in rehab. She increased her incline on the treadmill to 3% while maintaining a speed of 2.4 mph. She also increased to level 4 on the T4 nustep and biostep. We will continue to monitor her progress in the program. Beverley continues to do well in rehab. She was recently able to increase her treadmill workload to a speed of 2. and an incline of 4%. She was also able to maintainn level 4 on the T4 nustep. We will continue to monitor her progress in the program.   Expected Outcomes Short: Use RPE daily to regulate intensity. Long: Follow program prescription in THR. Short: Continue to follow exercise prescription. Long: Continue exercise to improve strength and stamina. Short: Try level 3 on the T4 nustep. Long: Continue exercise to improve strength and stamina. Short: Continue to progressively increase treadmill workload. Long: Continue exercise to improve strength and stamina.  Short: Continue to increase treadmill workload. Long: Continue exercise to improve strength and stamina.    Row Name 12/27/23 0756 01/03/24 0754 01/06/24 1110 01/18/24 1507       Exercise Goal Re-Evaluation   Exercise Goals Review Increase Physical Activity;Increase Strength and Stamina;Understanding of Exercise Prescription Increase Physical Activity;Increase Strength and Stamina;Understanding of Exercise Prescription Increase Physical Activity;Increase Strength and Stamina;Understanding of Exercise Prescription Increase Physical Activity;Increase Strength and Stamina;Understanding of Exercise Prescription    Comments Beverley continue to do well in rehab, she is going to [employer] some on days she doesnt attend rehab. Encouraged her to continue exercise at home. Beverley continues to do well in rehab, she is going to [employer] some. Beverley continues to do well in rehab. She increased her workload on  the treadmill to a speed of 2.8 mph and incline of 4.5%. She also increased to level 4 on the biostep. She maintained level 4 on the T4 nustep and level 3 on the recumbent bike.  We will continue to monitor her progress in the program. Beverley continues to do well in rehab and will graduate soon. She completed her post and improved by 22.89% with 1572ft. She increased her workload on the treadmill to a speed of 3 mph and incline of 5%. She also added the XR at level 4. We will continue to monitor her progress in the program until graduation.    Expected Outcomes STG: Increases work load as able. LTG: Continue to exercise to improve strength and stamina STG: Increases work load as able. LTG: Continue to exercise to improve strength and stamina Short: Continue to progressively increase treadmill workload. Long: Continue exercise to improve strength and stamina. Short: Graduate. Long: Continue to exercise independently.       Discharge Exercise Prescription (Final Exercise Prescription Changes):  Exercise Prescription Changes - 01/18/24 1500       Response to Exercise   Blood Pressure (Admit) 126/70    Blood Pressure (Exit) 118/66    Heart Rate (Admit) 75 bpm    Heart Rate (Exercise) 128 bpm    Heart Rate (Exit) 90 bpm    Oxygen Saturation (Admit) 96 %    Oxygen Saturation (Exercise) 91 %    Oxygen Saturation (Exit) 96 %    Rating of Perceived Exertion (Exercise) 13    Perceived Dyspnea (Exercise) 0    Symptoms none    Duration Continue with 30 min of aerobic exercise without signs/symptoms of physical distress.    Intensity THRR unchanged      Progression   Progression Continue to progress workloads to maintain intensity without signs/symptoms of physical distress.    Average METs 4.32      Resistance Training   Training Prescription Yes    Weight 3lb    Reps 10-15      Interval Training   Interval Training No      Treadmill   MPH 3    Grade 5    Minutes 15    METs 5.37       REL-XR   Level 4    Minutes 15      T5 Nustep   Level 3    Minutes 15    METs 2.6      Biostep-RELP   Level 3    Minutes 15    METs 3      Oxygen   Maintain Oxygen Saturation 88% or higher          Nutrition:  Target  Goals: Understanding of nutrition guidelines, daily intake of sodium 1500mg , cholesterol 200mg , calories 30% from fat and 7% or less from saturated fats, daily to have 5 or more servings of fruits and vegetables.  Education: All About Nutrition: -Group instruction provided by verbal, written material, interactive activities, discussions, models, and posters to present general guidelines for heart healthy nutrition including fat, fiber, MyPlate, the role of sodium in heart healthy nutrition, utilization of the nutrition label, and utilization of this knowledge for meal planning. Follow up email sent as well. Written material given at graduation. Flowsheet Row Cardiac Rehab from 01/19/2024 in [facility] Cardiac and Pulmonary Rehab  Education need identified 11/01/23  Date 11/24/23  Educator jg  Instruction Review Code 1- Verbalizes Understanding    Biometrics:  Pre Biometrics - 11/01/23 1111       Pre Biometrics   Height 5' 3.25 (1.607 m)    Weight 237 lb 14.4 oz (107.9 kg)    Waist Circumference 44.5 inches    Hip Circumference 51.5 inches    Waist to Hip Ratio 0.86 %    BMI (Calculated) 41.79    Single Leg Stand 20 seconds          Post Biometrics - 01/12/24 0759        Post  Biometrics   Height 5' 3.25 (1.607 m)    Weight 235 lb (106.6 kg)    Waist Circumference 45.5 inches    Hip Circumference 50 inches    Waist to Hip Ratio 0.91 %    BMI (Calculated) 41.28    Single Leg Stand 8.3 seconds          Nutrition Therapy Plan and Nutrition Goals:  Nutrition Therapy & Goals - 11/01/23 1029       Nutrition Therapy   RD appointment deferred Yes      Intervention Plan   Intervention Prescribe, educate and counsel regarding individualized  specific dietary modifications aiming towards targeted core components such as weight, hypertension, lipid management, diabetes, heart failure and other comorbidities.    Expected Outcomes Long Term Goal: Adherence to prescribed nutrition plan.;Short Term Goal: A plan has been developed with personal nutrition goals set during dietitian appointment.;Short Term Goal: Understand basic principles of dietary content, such as calories, fat, sodium, cholesterol and nutrients.          Nutrition Assessments:  MEDIFICTS Score Key: >=70 Need to make dietary changes  40-70 Heart Healthy Diet <= 40 Therapeutic Level Cholesterol Diet  Flowsheet Row Cardiac Rehab from 01/14/2024 in [facility] Cardiac and Pulmonary Rehab  Picture Your Plate Total Score on Admission 74  Picture Your Plate Total Score on Discharge 67   Picture Your Plate Scores: <59 Unhealthy dietary pattern with much room for improvement. 41-50 Dietary pattern unlikely to meet recommendations for good health and room for improvement. 51-60 More healthful dietary pattern, with some room for improvement.  >60 Healthy dietary pattern, although there may be some specific behaviors that could be improved.    Nutrition Goals Re-Evaluation:  Nutrition Goals Re-Evaluation     Row Name 12/13/23 1721 12/27/23 0803 01/03/24 0756         Goals   Current Weight 236 lb 8 oz (107.3 kg) -- --     Comment She would like to meet with the RD to learn more about portion control. Spoke with sue about eating smaller portions and building balanced plates that include protein, veggies and small portions of carbs. She is drinking more water than she used  to. Spoke with sue about portions, gave her examples of portions of some of her favorite foods. She likes fruit and was concerned about the sugars in them. Encouraged her to eat them in their whole form.     Expected Outcome Short: Meet with the RD. Long: Continue to try to manage diet. STG: read facts labels  and monitor sodium intake. LTG: Follow a heart healthy diet STG: Enjoy colorful produce and priortize whole foods. LTG: Follow a heart healthy diet        Nutrition Goals Discharge (Final Nutrition Goals Re-Evaluation):  Nutrition Goals Re-Evaluation - 01/03/24 0756       Goals   Comment Spoke with sue about portions, gave her examples of portions of some of her favorite foods. She likes fruit and was concerned about the sugars in them. Encouraged her to eat them in their whole form.    Expected Outcome STG: Enjoy colorful produce and priortize whole foods. LTG: Follow a heart healthy diet          Psychosocial: Target Goals: Acknowledge presence or absence of significant depression and/or stress, maximize coping skills, provide positive support system. Participant is able to verbalize types and ability to use techniques and skills needed for reducing stress and depression.   Education: Stress, Anxiety, and Depression - Group verbal and visual presentation to define topics covered.  Reviews how body is impacted by stress, anxiety, and depression.  Also discusses healthy ways to reduce stress and to treat/manage anxiety and depression.  Written material given at graduation. Flowsheet Row Cardiac Rehab from 01/19/2024 in [facility] Cardiac and Pulmonary Rehab  Date 01/05/24  Educator sb  Instruction Review Code 1- [employer] Understanding    Education: Sleep Hygiene -Provides group verbal and written instruction about how sleep can affect your health.  Define sleep hygiene, discuss sleep cycles and impact of sleep habits. Review good sleep hygiene tips.    Initial Review & Psychosocial Screening:  Initial Psych Review & Screening - 10/25/23 1424       Initial Review   Current issues with None Identified      Family Dynamics   Good Support System? Yes    Comments She lives alone and has a good support system, Her children live close [place] and has three dogs. She states no issues with  mental illness and does not take anything for her mood.      Barriers   Psychosocial barriers to participate in program There are no identifiable barriers or psychosocial needs.;The patient should benefit from training in stress management and relaxation.      Screening Interventions   Interventions Encouraged to exercise;To provide support and resources with identified psychosocial needs;Provide feedback about the scores to participant    Expected Outcomes Short Term goal: Utilizing psychosocial counselor, staff and physician to assist with identification of specific Stressors or current issues interfering with healing process. Setting desired goal for each stressor or current issue identified.;Long Term Goal: Stressors or current issues are controlled or eliminated.;Short Term goal: Identification and review with participant of any Quality of Life or Depression concerns found by scoring the questionnaire.;Long Term goal: The participant improves quality of Life and PHQ9 Scores as seen by post scores and/or verbalization of changes          Quality of Life Scores:   Quality of Life - 01/14/24 0746       Quality of Life Scores   Health/Function Pre 19 %    Health/Function Post 24.63 %  Health/Function % Change 29.63 %    Socioeconomic Pre 25.63 %    Socioeconomic Post 24.13 %    Socioeconomic % Change  -5.85 %    Psych/Spiritual Pre 23.93 %    Psych/Spiritual Post 23.07 %    Psych/Spiritual % Change -3.59 %    Family Pre 19.3 %    Family Post 24 %    Family % Change 24.35 %    GLOBAL Pre 21.54 %    GLOBAL Post 24.11 %    GLOBAL % Change 11.93 %         Scores of 19 and below usually indicate a poorer quality of life in these areas.  A difference of  2-3 points is a clinically meaningful difference.  A difference of 2-3 points in the total score of the Quality of Life Index has been associated with significant improvement in overall quality of life, self-image, physical  symptoms, and general health in studies assessing change in quality of life.  PHQ-9: Review Flowsheet       01/14/2024 11/01/2023  Depression screen PHQ 2/9  Decreased Interest - 0  Down, Depressed, Hopeless 0 0  PHQ - 2 Score 0 0  Altered sleeping - 1  Tired, decreased energy 1 1  Change in appetite 1 1  Feeling bad or failure about yourself  0 0  Trouble concentrating 0 0  Moving slowly or fidgety/restless 0 0  Suicidal thoughts 0 0  PHQ-9 Score - 3  Difficult doing work/chores Somewhat difficult Not difficult at all   Interpretation of Total Score  Total Score Depression Severity:  1-4 = Minimal depression, 5-9 = Mild depression, 10-14 = Moderate depression, 15-19 = Moderately severe depression, 20-27 = Severe depression   Psychosocial Evaluation and Intervention:  Psychosocial Evaluation - 10/25/23 1443       Psychosocial Evaluation & Interventions   Interventions Encouraged to exercise with the program and follow exercise prescription;Relaxation education;Stress management education    Comments She lives alone and has a good support system, Her children live close [place] and has three dogs. She states no issues with mental illness and does not take anything for her mood.    Expected Outcomes Short: Start [facility] to help with mood. Long: Maintain a healthy mental state    Continue Psychosocial Services  Follow up required by staff          Psychosocial Re-Evaluation:  Psychosocial Re-Evaluation     Row Name 12/13/23 1716 12/27/23 0801 01/03/24 0755         Psychosocial Re-Evaluation   Current issues with -- Current Sleep Concerns Current Sleep Concerns     Comments Beverley states that she is having a little bit of stress as of late. Her daughter recently had an accident with the weed-eater, and her grandchildren are living with her. She is not having sleep concerns at this time, but does say that she gets about 5 hours of sleep nightly. Beverley says she is not sleeping much,  goes to bed late. Usually around 11 or 12 and wakes up around 5am most mornings. Encouraged her to go to bed a few hours eariler. Beverley says she has not been going to sleep earlier like discussed a week ago. Encouraged her to work on better sleep habits     Expected Outcomes Short: Continue to work with her daughter, and manage her stress in healthy ways. Long: Continue to exercise in order to relieve stress. STG: Focus on going to bed eariler.  LTG: Continue to exercise in order to relieve stress. STG: Focus on going to bed eariler. LTG: Continue to exercise in order to relieve stress.     Interventions Encouraged to attend Cardiac Rehabilitation for the exercise Encouraged to attend Cardiac Rehabilitation for the exercise Encouraged to attend Cardiac Rehabilitation for the exercise     Continue Psychosocial Services  Follow up required by staff Follow up required by staff Follow up required by staff        Psychosocial Discharge (Final Psychosocial Re-Evaluation):  Psychosocial Re-Evaluation - 01/03/24 0755       Psychosocial Re-Evaluation   Current issues with Current Sleep Concerns    Comments Beverley says she has not been going to sleep earlier like discussed a week ago. Encouraged her to work on better sleep habits    Expected Outcomes STG: Focus on going to bed eariler. LTG: Continue to exercise in order to relieve stress.    Interventions Encouraged to attend Cardiac Rehabilitation for the exercise    Continue Psychosocial Services  Follow up required by staff          Vocational Rehabilitation: Provide vocational rehab assistance to qualifying candidates.   Vocational Rehab Evaluation & Intervention:   Education: Education Goals: Education classes will be provided on a variety of topics geared toward better understanding of heart health and risk factor modification. Participant will state understanding/return demonstration of topics presented as noted by education test  scores.  Learning Barriers/Preferences:  Learning Barriers/Preferences - 10/25/23 1424       Learning Barriers/Preferences   Learning Barriers None    Learning Preferences None          General Cardiac Education Topics:  AED/CPR: - Group verbal and written instruction with the use of models to demonstrate the basic use of the AED with the basic ABC's of resuscitation.   Anatomy and Cardiac Procedures: - Group verbal and visual presentation and models provide information about basic cardiac anatomy and function. Reviews the testing methods done to diagnose heart disease and the outcomes of the test results. Describes the treatment choices: Medical Management, Angioplasty, or Coronary Bypass Surgery for treating various heart conditions including Myocardial Infarction, Angina, Valve Disease, and Cardiac Arrhythmias.  Written material given at graduation. Flowsheet Row Cardiac Rehab from 01/19/2024 in [facility] Cardiac and Pulmonary Rehab  Education need identified 11/01/23  Date 12/08/23  Educator SB  Instruction Review Code 1- Verbalizes Understanding    Medication Safety: - Group verbal and visual instruction to review commonly prescribed medications for heart and lung disease. Reviews the medication, class of the drug, and side effects. Includes the steps to properly store meds and maintain the prescription regimen.  Written material given at graduation.   Intimacy: - Group verbal instruction through game format to discuss how heart and lung disease can affect sexual intimacy. Written material given at graduation..   Know Your Numbers and Heart Failure: - Group verbal and visual instruction to discuss disease risk factors for cardiac and pulmonary disease and treatment options.  Reviews associated critical values for Overweight/Obesity, Hypertension, Cholesterol, and Diabetes.  Discusses basics of heart failure: signs/symptoms and treatments.  Introduces Heart Failure Zone chart for  action plan for heart failure.  Written material given at graduation. Flowsheet Row Cardiac Rehab from 01/19/2024 in [facility] Cardiac and Pulmonary Rehab  Education need identified 11/01/23    Infection Prevention: - Provides verbal and written material to individual with discussion of infection control including proper hand washing and proper equipment cleaning  during exercise session. Flowsheet Row Cardiac Rehab from 01/19/2024 in [facility] Cardiac and Pulmonary Rehab  Date 10/25/23  Educator jh  Instruction Review Code 1- Verbalizes Understanding    Falls Prevention: - Provides verbal and written material to individual with discussion of falls prevention and safety. Flowsheet Row Cardiac Rehab from 01/19/20[facility] in [facility] Cardiac and Pulmonary Rehab  Date 10/25/23  Educator jh  Instruction Review Code 1- Verbalizes Understanding    Other: -Provides group and verbal instruction on various topics (see comments)   Knowledge Questionnaire Score:  Knowledge Questionnaire Score - 01/14/24 0746       Knowledge Questionnaire Score   Pre Score 18/26    Post Score 24/26          Core Components/Risk Factors/Patient Goals at Admission:  Personal Goals and Risk Factors at Admission - 10/25/23 1424       Core Components/Risk Factors/Patient Goals on Admission    Weight Management Yes;Weight Loss    Intervention Weight Management: Provide education and appropriate resources to help participant work on and attain dietary goals.;Weight Management: Develop a combined nutrition and exercise program designed to reach desired caloric intake, while maintaining appropriate intake of nutrient and fiber, sodium and fats, and appropriate energy expenditure required for the weight goal.;Weight Management/Obesity: Establish reasonable short term and long term weight goals.;Obesity: Provide education and appropriate resources to help participant work on and attain dietary goals.    Expected Outcomes Short Term:  Continue to assess and modify interventions until short term weight is achieved;Understanding recommendations for meals to include 15-35% energy as protein, 25-35% energy from fat, 35-60% energy from carbohydrates, less than 200mg  of dietary cholesterol, 20-35 gm of total fiber daily;Understanding of distribution of calorie intake throughout the day with the consumption of 4-5 meals/snacks;Weight Loss: Understanding of general recommendations for a balanced deficit meal plan, which promotes 1-2 lb weight loss per week and includes a negative energy balance of [phone] kcal/d    Hypertension Yes    Intervention Provide education on lifestyle modifcations including regular physical activity/exercise, weight management, moderate sodium restriction and increased consumption of fresh fruit, vegetables, and low fat dairy, alcohol moderation, and smoking cessation.;Monitor prescription use compliance.    Expected Outcomes Short Term: Continued assessment and intervention until BP is < 140/44mm HG in hypertensive participants. < 130/44mm HG in hypertensive participants with diabetes, heart failure or chronic kidney disease.;Long Term: Maintenance of blood pressure at goal levels.    Lipids Yes    Intervention Provide education and support for participant on nutrition & aerobic/resistive exercise along with prescribed medications to achieve LDL 70mg , HDL >40mg .    Expected Outcomes Short Term: Participant states understanding of desired cholesterol values and is compliant with medications prescribed. Participant is following exercise prescription and nutrition guidelines.;Long Term: Cholesterol controlled with medications as prescribed, with individualized exercise RX and with personalized nutrition plan. Value goals: LDL < 70mg , HDL > 40 mg.          Education:Diabetes - Individual verbal and written instruction to review signs/symptoms of diabetes, desired ranges of glucose level fasting, after meals and with  exercise. Acknowledge that pre and post exercise glucose checks will be done for 3 sessions at entry of program.   Core Components/Risk Factors/Patient Goals Review:   Goals and Risk Factor Review     Row Name 12/13/23 1723 12/27/23 0804 01/03/24 0757         Core Components/Risk Factors/Patient Goals Review   Personal Goals Review Weight Management/Obesity;Hypertension Hypertension Hypertension  Review Beverley is still trying to lose weight. She states her long term weight goal is 120lb. She is currently exercising in the program and at a gym on her off days. Beverley is still taking her BP at least once a day. She states that she tries to take it every morning, and after dinner. Beverley reports she checks her BP at home, that it matches closely with the readings taken here at rehab. Beverley continues to check her BP at home, reports it is stable and matches closely with readings here at rehab     Expected Outcomes Short: Continue to take BP and manage diet. Long: Talk to the RD, continue to manage hypertension through exercise and diet. STG: Continue to check BP at home. LTG: manage risk factors independently STG: Continue to check BP at home. LTG: manage risk factors independently        Core Components/Risk Factors/Patient Goals at Discharge (Final Review):   Goals and Risk Factor Review - 01/03/24 0757       Core Components/Risk Factors/Patient Goals Review   Personal Goals Review Hypertension    Review Beverley continues to check her BP at home, reports it is stable and matches closely with readings here at rehab    Expected Outcomes STG: Continue to check BP at home. LTG: manage risk factors independently          ITP Comments:  ITP Comments     Row Name 10/25/23 1423 11/01/23 1026 11/03/23 0829 11/24/23 1013 12/22/23 1200   ITP Comments Virtual Visit completed. Patient informed on EP and RD appointment and 6 Minute walk test. Patient also informed of patient health questionnaires on My Chart.  Patient Verbalizes understanding. Visit diagnosis can be found in [facility] 08/24/2023. Completed and gym orientation for cardiac rehab. Initial ITP created and sent for review to Dr. Oneil Pinal, Medical Director. First full day of exercise!  Patient was oriented to gym and equipment including functions, settings, policies, and procedures.  Patient's individual exercise prescription and treatment plan were reviewed.  All starting workloads were established based on the results of the 6 minute walk test done at initial orientation visit.  The plan for exercise progression was also introduced and progression will be customized based on patient's performance and goals. 30 Day review completed. Medical Director ITP review done, changes made as directed, and signed approval by Medical Director.    new toprgram 30 Day review completed. Medical Director ITP review done, changes made as directed, and signed approval by Medical Director.    Row Name 01/19/24 0846           ITP Comments 30 Day review completed. Medical Director ITP review done, changes made as directed, and signed approval by Medical Director.          Comments: 30 day review

## 2024-01-21 ENCOUNTER — Encounter: Attending: Family Medicine

## 2024-01-21 DIAGNOSIS — Z955 Presence of coronary angioplasty implant and graft: Secondary | ICD-10-CM | POA: Diagnosis present

## 2024-01-21 NOTE — Progress Notes (Signed)
 Daily Session Note  Patient Details  Name: Dana Irwin MRN: [id number] Date of Birth: Sep 03, 1955 Referring Provider:   Flowsheet Row Cardiac Rehab from 11/01/2023 in [facility] Cardiac and Pulmonary Rehab  Referring Provider Dr. Margery Ruth, MD    Encounter Date: 01/21/2024  Check In:  Session Check In - 01/21/24 0807       Check-In   Supervising physician immediately available to respond to emergencies See telemetry face sheet for immediately available ER MD    Location [facility]-Cardiac & Pulmonary Rehab    Staff Present Josette Shallow RN,BC,MSN;Noah Tickle, [place], Exercise Physiologist;Joseph Rolinda RCP,RRT,BSRT    Virtual Visit No    Medication changes reported     No    Fall or balance concerns reported    No    Warm-up and Cool-down Performed on first and last piece of equipment    Resistance Training Performed Yes    VAD Patient? No    PAD/SET Patient? No      Pain Assessment   Currently in Pain? No/denies    Multiple Pain Sites No             Social History   Tobacco Use  Smoking Status Never  Smokeless Tobacco Not on file    Goals Met:  Independence with exercise equipment Exercise tolerated well No report of concerns or symptoms today Strength training completed today  Goals Unmet:  Not Applicable  Comments: Pt able to follow exercise prescription today without complaint.  Will continue to monitor for progression.    Dr. Oneil Pinal is Medical Director for [facility] Cardiac Rehabilitation.  Dr. Fuad Aleskerov is Medical Director for [facility] Pulmonary Rehabilitation.

## 2024-01-24 ENCOUNTER — Encounter

## 2024-01-24 DIAGNOSIS — Z955 Presence of coronary angioplasty implant and graft: Secondary | ICD-10-CM

## 2024-01-24 NOTE — Progress Notes (Signed)
 Cardiac Individual Treatment Plan  Patient Details  Name: Dana Irwin MRN: [id number] Date of Birth: 11-23-1955 Referring Provider:   Flowsheet Row Cardiac Rehab from 11/01/2023 in [facility] Cardiac and Pulmonary Rehab  Referring Provider Dr. Margery Ruth, MD    Initial Encounter Date:  Flowsheet Row Cardiac Rehab from 11/01/2023 in [facility] Cardiac and Pulmonary Rehab  Date 11/01/23    Visit Diagnosis: Status post coronary artery stent placement  Patient's Home Medications on Admission:  Current Outpatient Medications:    acetaminophen (TYLENOL) 325 MG tablet, Take 650 mg by mouth., Disp: , Rfl:    acetaminophen (TYLENOL) 650 MG CR tablet, Take 650 mg by mouth every 8 (eight) hours as needed for pain., Disp: , Rfl:    albuterol (VENTOLIN HFA) 108 (90 Base) MCG/ACT inhaler, Inhale 2 puffs into the lungs., Disp: , Rfl:    aspirin EC 81 MG tablet, Take 81 mg by mouth., Disp: , Rfl:    cefdinir (OMNICEF) 300 MG capsule, Take 300 mg by mouth 2 (two) times daily. (Patient not taking: Reported on 10/25/2023), Disp: , Rfl:    cetirizine (ZYRTEC) 10 MG chewable tablet, Chew 10 mg by mouth daily. As needed (Patient not taking: Reported on 10/25/2023), Disp: , Rfl:    cetirizine (ZYRTEC) 10 MG tablet, Take 10 mg by mouth. (Patient not taking: Reported on 10/25/2023), Disp: , Rfl:    clopidogrel (PLAVIX) 75 MG tablet, Take 75 mg by mouth., Disp: , Rfl:    colchicine 0.6 MG tablet, Take 0.6 mg by mouth. (Patient not taking: Reported on 10/25/2023), Disp: , Rfl:    Digestive Enzymes (DIGESTIVE ENZYME PO), Take 1 tablet by mouth daily., Disp: , Rfl:    ibuprofen (ADVIL) 200 MG tablet, Take 200 mg by mouth every 6 (six) hours as needed for mild pain. As needed (Patient not taking: Reported on 10/25/2023), Disp: , Rfl:    ibuprofen (ADVIL) 600 MG tablet, TAKE 1 TABLET BY MOUTH EVERY 8 HOURS FOR 14 DAYS (Patient not taking: Reported on 10/25/2023), Disp: , Rfl:    Multiple Vitamin (MULTIVITAMIN) tablet, Take 1 tablet by mouth  daily., Disp: , Rfl:    rosuvastatin (CRESTOR) 20 MG tablet, Take 20 mg by mouth at bedtime. (Patient not taking: Reported on 10/25/2023), Disp: , Rfl:    rosuvastatin (CRESTOR) 40 MG tablet, Take 40 mg by mouth., Disp: , Rfl:    triamcinolone cream (KENALOG) 0.1 %, Apply 1 Application topically 2 (two) times daily. As needed (Patient not taking: Reported on 10/25/2023), Disp: , Rfl:    triamcinolone cream (KENALOG) 0.1 %, Apply 1 Application topically. (Patient not taking: Reported on 10/25/2023), Disp: , Rfl:   Past Medical History: Past Medical History:  Diagnosis Date   Abnormal Pap smear of cervix    Allergies    Asthma    Hyperlipidemia     Tobacco Use: Social History   Tobacco Use  Smoking Status Never  Smokeless Tobacco Not on file    Labs: Review Flowsheet        No data to display           Exercise Target Goals: Exercise Program Goal: Individual exercise prescription set using results from initial 6 min walk test and THRR while considering  patient's activity barriers and safety.   Exercise Prescription Goal: Initial exercise prescription builds to 30-45 minutes a day of aerobic activity, 2-3 days per week.  Home exercise guidelines will be given to patient during program as part of exercise prescription that the  participant will acknowledge.   Education: Aerobic Exercise: - Group verbal and visual presentation on the components of exercise prescription. Introduces F.I.T.T principle from ACSM for exercise prescriptions.  Reviews F.I.T.T. principles of aerobic exercise including progression. Written material given at graduation. Flowsheet Row Cardiac Rehab from 01/19/2024 in [facility] Cardiac and Pulmonary Rehab  Education need identified 11/01/23    Education: Resistance Exercise: - Group verbal and visual presentation on the components of exercise prescription. Introduces F.I.T.T principle from ACSM for exercise prescriptions  Reviews F.I.T.T. principles of resistance  exercise including progression. Written material given at graduation. Flowsheet Row Cardiac Rehab from 01/19/2024 in [facility] Cardiac and Pulmonary Rehab  Date 11/10/23  Educator nt  Instruction Review Code 1- TEFL teacher Understanding     Education: Exercise & Equipment Safety: - Individual verbal instruction and demonstration of equipment use and safety with use of the equipment. Flowsheet Row Cardiac Rehab from 01/19/2024 in [facility] Cardiac and Pulmonary Rehab  Date 10/25/23  Educator jh  Instruction Review Code 1- Verbalizes Understanding    Education: Exercise Physiology & General Exercise Guidelines: - Group verbal and written instruction with models to review the exercise physiology of the cardiovascular system and associated critical values. Provides general exercise guidelines with specific guidelines to those with heart or lung disease.  Flowsheet Row Cardiac Rehab from 01/19/2024 in [facility] Cardiac and Pulmonary Rehab  Date 01/12/24  Educator kh  Instruction Review Code 1- [employer] Understanding    Education: Flexibility, Balance, Mind/Body Relaxation: - Group verbal and visual presentation with interactive activity on the components of exercise prescription. Introduces F.I.T.T principle from ACSM for exercise prescriptions. Reviews F.I.T.T. principles of flexibility and balance exercise training including progression. Also discusses the mind body connection.  Reviews various relaxation techniques to help reduce and manage stress (i.e. Deep breathing, progressive muscle relaxation, and visualization). Balance handout provided to take home. Written material given at graduation. Flowsheet Row Cardiac Rehab from 01/19/2024 in [facility] Cardiac and Pulmonary Rehab  Date 11/10/23  Educator NT  Instruction Review Code 1- Verbalizes Understanding    Activity Barriers & Risk Stratification:  Activity Barriers & Cardiac Risk Stratification - 11/01/23 1110       Activity Barriers & Cardiac Risk  Stratification   Activity Barriers Arthritis;Other (comment)    Comments Hx L foot injury, arthritis in knees    Cardiac Risk Stratification Moderate          6 Minute Walk:  6 Minute Walk     Row Name 11/01/23 1034 01/12/24 0757       6 Minute Walk   Phase Initial Discharge    Distance 1245 feet 1530 feet    Distance % Change -- 22.89 %    Distance Feet Change -- 285 ft    Walk Time 6 minutes 6 minutes    # of Rest Breaks 0 0    MPH 2.36 2.9    METS 2.29 3.11    RPE 7 12    Perceived Dyspnea  0 0    VO2 Peak 8 10.89    Symptoms No No    Resting HR 67 bpm 88 bpm    Resting BP 132/76 120/70    Resting Oxygen Saturation  97 % 94 %    Exercise Oxygen Saturation  during 6 min walk 96 % 93 %    Max Ex. HR 102 bpm 117 bpm    Max Ex. BP 146/84 162/80    2 Minute Post BP 140/82 138/78  Oxygen Initial Assessment:   Oxygen Re-Evaluation:   Oxygen Discharge (Final Oxygen Re-Evaluation):   Initial Exercise Prescription:  Initial Exercise Prescription - 11/01/23 1100       Date of Initial Exercise RX and Referring Provider   Date 11/01/23    Referring Provider Dr. Margery Ruth, MD      Oxygen   Maintain Oxygen Saturation 88% or higher      Treadmill   MPH 2.4    Grade 0.5    Minutes 15    METs 3      NuStep   Level 2    SPM 80    Minutes 15    METs 2.29      Biostep-RELP   Level 2    SPM 50    Minutes 15    METs 2.29      Prescription Details   Frequency (times per week) 3    Duration Progress to 30 minutes of continuous aerobic without signs/symptoms of physical distress      Intensity   THRR 40-80% of Max Heartrate 101-135    Ratings of Perceived Exertion 11-13    Perceived Dyspnea 0-4      Progression   Progression Continue to progress workloads to maintain intensity without signs/symptoms of physical distress.      Resistance Training   Training Prescription Yes    Weight 3 lb    Reps 10-15          Perform Capillary Blood  Glucose checks as needed.  Exercise Prescription Changes:   Exercise Prescription Changes     Row Name 11/01/23 1100 11/08/23 1400 11/24/23 1100 12/07/23 1400 12/22/23 1400     Response to Exercise   Blood Pressure (Admit) 132/76 124/60 120/68 114/68 138/76   Blood Pressure (Exercise) 146/84 124/70 132/74 170/80 --   Blood Pressure (Exit) 140/82 118/70 122/76 138/82 112/74   Heart Rate (Admit) 67 bpm 76 bpm 72 bpm 86 bpm 73 bpm   Heart Rate (Exercise) 102 bpm 120 bpm 117 bpm 130 bpm 124 bpm   Heart Rate (Exit) 74 bpm 84 bpm 79 bpm 93 bpm 89 bpm   Oxygen Saturation (Admit) 97 % -- -- -- 94 %   Oxygen Saturation (Exercise) 96 % -- -- -- 91 %   Oxygen Saturation (Exit) -- -- -- -- 94 %   Rating of Perceived Exertion (Exercise) 7 12 13 14 13    Perceived Dyspnea (Exercise) 0 0 -- -- --   Symptoms none none none none none   Comments Results First 2 weeks of exercise -- -- --   Duration -- Progress to 30 minutes of  aerobic without signs/symptoms of physical distress Continue with 30 min of aerobic exercise without signs/symptoms of physical distress. Continue with 30 min of aerobic exercise without signs/symptoms of physical distress. Continue with 30 min of aerobic exercise without signs/symptoms of physical distress.   Intensity -- THRR unchanged THRR unchanged THRR unchanged THRR unchanged     Progression   Progression -- Continue to progress workloads to maintain intensity without signs/symptoms of physical distress. Continue to progress workloads to maintain intensity without signs/symptoms of physical distress. Continue to progress workloads to maintain intensity without signs/symptoms of physical distress. Continue to progress workloads to maintain intensity without signs/symptoms of physical distress.   Average METs -- 2.6 2.56 3.24 3.67     Resistance Training   Training Prescription -- Yes Yes Yes Yes   Weight -- 3lb 3lb 3lb 3lb  Reps -- 10-15 10-15 10-15 10-15      Interval Training   Interval Training -- No No No No     Treadmill   MPH -- 2.4 2.4 2.4 2.5   Grade -- 0.5 0.5 3 4    Minutes -- 15 15 15 15    METs -- 3 3 3.83 4.29     Recumbant Bike   Level -- -- 4 -- --   RPM -- -- 50 -- --   Watts -- -- 25 -- --   Minutes -- -- 15 -- --   METs -- -- 2.73 -- --     NuStep   Level -- 2 2 4 4    Minutes -- 15 15 15 15    METs -- 2.5 2.5 2.6 3.2     T5 Nustep   Level -- -- 1 -- --   SPM -- -- 80 -- --   Minutes -- -- 15 -- --   METs -- -- 2 -- --     Biostep-RELP   Level -- 2 2 4 3    Minutes -- 15 15 15 15    METs -- 2 2 3 4      Oxygen   Maintain Oxygen Saturation -- 88% or higher 88% or higher 88% or higher 88% or higher    Row Name 01/06/24 1100 01/18/24 1500           Response to Exercise   Blood Pressure (Admit) 118/60 126/70      Blood Pressure (Exit) 112/66 118/66      Heart Rate (Admit) 81 bpm 75 bpm      Heart Rate (Exercise) 121 bpm 128 bpm      Heart Rate (Exit) 91 bpm 90 bpm      Oxygen Saturation (Admit) 96 % 96 %      Oxygen Saturation (Exercise) 93 % 91 %      Oxygen Saturation (Exit) 96 % 96 %      Rating of Perceived Exertion (Exercise) 14 13      Perceived Dyspnea (Exercise) 0 0      Symptoms none none      Duration Continue with 30 min of aerobic exercise without signs/symptoms of physical distress. Continue with 30 min of aerobic exercise without signs/symptoms of physical distress.      Intensity THRR unchanged THRR unchanged        Progression   Progression Continue to progress workloads to maintain intensity without signs/symptoms of physical distress. Continue to progress workloads to maintain intensity without signs/symptoms of physical distress.      Average METs 3.92 4.32        Resistance Training   Training Prescription Yes Yes      Weight 3lb 3lb      Reps 10-15 10-15        Interval Training   Interval Training No No        Treadmill   MPH 2.8 3      Grade 4.5 5      Minutes 15 15       METs 4.88 5.37        Recumbant Bike   Level 3 --      Watts 25 --      Minutes 15 --      METs 2.73 --        NuStep   Level 4 --      Minutes 15 --      METs 3.2 --  REL-XR   Level -- 4      Minutes -- 15        T5 Nustep   Level -- 3      Minutes -- 15      METs -- 2.6        Biostep-RELP   Level 4 3      Minutes 15 15      METs 3 3        Oxygen   Maintain Oxygen Saturation 88% or higher 88% or higher         Exercise Comments:   Exercise Comments     Row Name 11/03/23 0829 01/24/24 0756         Exercise Comments First full day of exercise!  Patient was oriented to gym and equipment including functions, settings, policies, and procedures.  Patient's individual exercise prescription and treatment plan were reviewed.  All starting workloads were established based on the results of the 6 minute walk test done at initial orientation visit.  The plan for exercise progression was also introduced and progression will be customized based on patient's performance and goals. Dana Irwin graduated today from  rehab with 36 sessions completed.  Details of the patient's exercise prescription and what She needs to do in order to continue the prescription and progress were discussed with patient.  Patient was given a copy of prescription and goals.  Patient verbalized understanding. Dana Irwin plans to continue to exercise by going to [employer].         Exercise Goals and Review:   Exercise Goals     Row Name 11/01/23 1110             Exercise Goals   Increase Physical Activity Yes       Intervention Provide advice, education, support and counseling about physical activity/exercise needs.;Develop an individualized exercise prescription for aerobic and resistive training based on initial evaluation findings, risk stratification, comorbidities and participant's personal goals.       Expected Outcomes Short Term: Attend rehab on a regular basis to increase amount of  physical activity.;Long Term: Add in home exercise to make exercise part of routine and to increase amount of physical activity.;Long Term: Exercising regularly at least 3-5 days a week.       Increase Strength and Stamina Yes       Intervention Provide advice, education, support and counseling about physical activity/exercise needs.;Develop an individualized exercise prescription for aerobic and resistive training based on initial evaluation findings, risk stratification, comorbidities and participant's personal goals.       Expected Outcomes Short Term: Increase workloads from initial exercise prescription for resistance, speed, and METs.;Short Term: Perform resistance training exercises routinely during rehab and add in resistance training at home;Long Term: Improve cardiorespiratory fitness, muscular endurance and strength as measured by increased METs and functional capacity ( )       Able to understand and use rate of perceived exertion (RPE) scale Yes       Intervention Provide education and explanation on how to use RPE scale       Expected Outcomes Short Term: Able to use RPE daily in rehab to express subjective intensity level;Long Term:  Able to use RPE to guide intensity level when exercising independently       Able to understand and use Dyspnea scale Yes       Intervention Provide education and explanation on how to use Dyspnea scale       Expected Outcomes Short  Term: Able to use Dyspnea scale daily in rehab to express subjective sense of shortness of breath during exertion;Long Term: Able to use Dyspnea scale to guide intensity level when exercising independently       Knowledge and understanding of Target Heart Rate Range (THRR) Yes       Intervention Provide education and explanation of THRR including how the numbers were predicted and where they are located for reference       Expected Outcomes Short Term: Able to state/look up THRR;Long Term: Able to use THRR to govern intensity  when exercising independently;Short Term: Able to use daily as guideline for intensity in rehab       Able to check pulse independently Yes       Intervention Provide education and demonstration on how to check pulse in carotid and radial arteries.;Review the importance of being able to check your own pulse for safety during independent exercise       Expected Outcomes Short Term: Able to explain why pulse checking is important during independent exercise;Long Term: Able to check pulse independently and accurately       Understanding of Exercise Prescription Yes       Intervention Provide education, explanation, and written materials on patient's individual exercise prescription       Expected Outcomes Short Term: Able to explain program exercise prescription;Long Term: Able to explain home exercise prescription to exercise independently          Exercise Goals Re-Evaluation :  Exercise Goals Re-Evaluation     Row Name 11/03/23 0830 11/08/23 1413 11/24/23 1142 12/07/23 1455 12/22/23 1405     Exercise Goal Re-Evaluation   Exercise Goals Review Able to understand and use rate of perceived exertion (RPE) scale;Able to understand and use Dyspnea scale;Knowledge and understanding of Target Heart Rate Range (THRR);Understanding of Exercise Prescription Increase Strength and Stamina;Increase Physical Activity;Understanding of Exercise Prescription Increase Strength and Stamina;Increase Physical Activity;Understanding of Exercise Prescription Increase Strength and Stamina;Increase Physical Activity;Understanding of Exercise Prescription Increase Strength and Stamina;Increase Physical Activity;Understanding of Exercise Prescription   Comments Reviewed RPE and dyspnea scale, THR and program prescription with pt today.  Pt voiced understanding and was given a copy of goals to take home. Dana Irwin is off to a good start in the program. She was able to attend her first 2 sessions during this review period. During  the 2 sessions she was able to use the treadmill at a workload of 2. and 0.5%, and the T4 nustep at level 2. We will continue to monitor her progress in the program. Dana Irwin is off to a good start in the program. She maintained her workload on the treadmill at a speed of 2.4 mph with 0.5% incline. She also maintained level 2 on the T4 and biostep. She added the recumbent bike to her prescription at level 4 and the T5 nustep at level 1. We will continue to monitor her progress in the program. Dana Irwin is doing well in rehab. She increased her incline on the treadmill to 3% while maintaining a speed of 2.4 mph. She also increased to level 4 on the T4 nustep and biostep. We will continue to monitor her progress in the program. Dana Irwin continues to do well in rehab. She was recently able to increase her treadmill workload to a speed of 2. and an incline of 4%. She was also able to maintainn level 4 on the T4 nustep. We will continue to monitor her progress in the program.   Expected  Outcomes Short: Use RPE daily to regulate intensity. Long: Follow program prescription in THR. Short: Continue to follow exercise prescription. Long: Continue exercise to improve strength and stamina. Short: Try level 3 on the T4 nustep. Long: Continue exercise to improve strength and stamina. Short: Continue to progressively increase treadmill workload. Long: Continue exercise to improve strength and stamina. Short: Continue to increase treadmill workload. Long: Continue exercise to improve strength and stamina.    Row Name 12/27/23 0756 01/03/24 0754 01/06/24 1110 01/18/24 1507       Exercise Goal Re-Evaluation   Exercise Goals Review Increase Physical Activity;Increase Strength and Stamina;Understanding of Exercise Prescription Increase Physical Activity;Increase Strength and Stamina;Understanding of Exercise Prescription Increase Physical Activity;Increase Strength and Stamina;Understanding of Exercise Prescription Increase Physical  Activity;Increase Strength and Stamina;Understanding of Exercise Prescription    Comments Dana Irwin continue to do well in rehab, she is going to [employer] some on days she doesnt attend rehab. Encouraged her to continue exercise at home. Dana Irwin continues to do well in rehab, she is going to [employer] some. Dana Irwin continues to do well in rehab. She increased her workload on the treadmill to a speed of 2.8 mph and incline of 4.5%. She also increased to level 4 on the biostep. She maintained level 4 on the T4 nustep and level 3 on the recumbent bike.  We will continue to monitor her progress in the program. Dana Irwin continues to do well in rehab and will graduate soon. She completed her post and improved by 22.89% with 1547ft. She increased her workload on the treadmill to a speed of 3 mph and incline of 5%. She also added the XR at level 4. We will continue to monitor her progress in the program until graduation.    Expected Outcomes STG: Increases work load as able. LTG: Continue to exercise to improve strength and stamina STG: Increases work load as able. LTG: Continue to exercise to improve strength and stamina Short: Continue to progressively increase treadmill workload. Long: Continue exercise to improve strength and stamina. Short: Graduate. Long: Continue to exercise independently.       Discharge Exercise Prescription (Final Exercise Prescription Changes):  Exercise Prescription Changes - 01/18/24 1500       Response to Exercise   Blood Pressure (Admit) 126/70    Blood Pressure (Exit) 118/66    Heart Rate (Admit) 75 bpm    Heart Rate (Exercise) 128 bpm    Heart Rate (Exit) 90 bpm    Oxygen Saturation (Admit) 96 %    Oxygen Saturation (Exercise) 91 %    Oxygen Saturation (Exit) 96 %    Rating of Perceived Exertion (Exercise) 13    Perceived Dyspnea (Exercise) 0    Symptoms none    Duration Continue with 30 min of aerobic exercise without signs/symptoms of physical distress.    Intensity  THRR unchanged      Progression   Progression Continue to progress workloads to maintain intensity without signs/symptoms of physical distress.    Average METs 4.32      Resistance Training   Training Prescription Yes    Weight 3lb    Reps 10-15      Interval Training   Interval Training No      Treadmill   MPH 3    Grade 5    Minutes 15    METs 5.37      REL-XR   Level 4    Minutes 15      T5 Nustep  Level 3    Minutes 15    METs 2.6      Biostep-RELP   Level 3    Minutes 15    METs 3      Oxygen   Maintain Oxygen Saturation 88% or higher          Nutrition:  Target Goals: Understanding of nutrition guidelines, daily intake of sodium 1500mg , cholesterol 200mg , calories 30% from fat and 7% or less from saturated fats, daily to have 5 or more servings of fruits and vegetables.  Education: All About Nutrition: -Group instruction provided by verbal, written material, interactive activities, discussions, models, and posters to present general guidelines for heart healthy nutrition including fat, fiber, MyPlate, the role of sodium in heart healthy nutrition, utilization of the nutrition label, and utilization of this knowledge for meal planning. Follow up email sent as well. Written material given at graduation. Flowsheet Row Cardiac Rehab from 01/19/2024 in [facility] Cardiac and Pulmonary Rehab  Education need identified 11/01/23  Date 11/24/23  Educator jg  Instruction Review Code 1- Verbalizes Understanding    Biometrics:  Pre Biometrics - 11/01/23 1111       Pre Biometrics   Height 5' 3.25 (1.607 m)    Weight 237 lb 14.4 oz (107.9 kg)    Waist Circumference 44.5 inches    Hip Circumference 51.5 inches    Waist to Hip Ratio 0.86 %    BMI (Calculated) 41.79    Single Leg Stand 20 seconds          Post Biometrics - 01/12/24 0759        Post  Biometrics   Height 5' 3.25 (1.607 m)    Weight 235 lb (106.6 kg)    Waist Circumference 45.5 inches     Hip Circumference 50 inches    Waist to Hip Ratio 0.91 %    BMI (Calculated) 41.28    Single Leg Stand 8.3 seconds          Nutrition Therapy Plan and Nutrition Goals:  Nutrition Therapy & Goals - 11/01/23 1029       Nutrition Therapy   RD appointment deferred Yes      Intervention Plan   Intervention Prescribe, educate and counsel regarding individualized specific dietary modifications aiming towards targeted core components such as weight, hypertension, lipid management, diabetes, heart failure and other comorbidities.    Expected Outcomes Long Term Goal: Adherence to prescribed nutrition plan.;Short Term Goal: A plan has been developed with personal nutrition goals set during dietitian appointment.;Short Term Goal: Understand basic principles of dietary content, such as calories, fat, sodium, cholesterol and nutrients.          Nutrition Assessments:  MEDIFICTS Score Key: >=70 Need to make dietary changes  40-70 Heart Healthy Diet <= 40 Therapeutic Level Cholesterol Diet  Flowsheet Row Cardiac Rehab from 01/14/2024 in [facility] Cardiac and Pulmonary Rehab  Picture Your Plate Total Score on Admission 74  Picture Your Plate Total Score on Discharge 67   Picture Your Plate Scores: <59 Unhealthy dietary pattern with much room for improvement. 41-50 Dietary pattern unlikely to meet recommendations for good health and room for improvement. 51-60 More healthful dietary pattern, with some room for improvement.  >60 Healthy dietary pattern, although there may be some specific behaviors that could be improved.    Nutrition Goals Re-Evaluation:  Nutrition Goals Re-Evaluation     Row Name 12/13/23 1721 12/27/23 0803 01/03/24 0756         Goals  Current Weight 236 lb 8 oz (107.3 kg) -- --     Comment She would like to meet with the RD to learn more about portion control. Spoke with sue about eating smaller portions and building balanced plates that include protein, veggies and  small portions of carbs. She is drinking more water than she used to. Spoke with sue about portions, gave her examples of portions of some of her favorite foods. She likes fruit and was concerned about the sugars in them. Encouraged her to eat them in their whole form.     Expected Outcome Short: Meet with the RD. Long: Continue to try to manage diet. STG: read facts labels and monitor sodium intake. LTG: Follow a heart healthy diet STG: Enjoy colorful produce and priortize whole foods. LTG: Follow a heart healthy diet        Nutrition Goals Discharge (Final Nutrition Goals Re-Evaluation):  Nutrition Goals Re-Evaluation - 01/03/24 0756       Goals   Comment Spoke with sue about portions, gave her examples of portions of some of her favorite foods. She likes fruit and was concerned about the sugars in them. Encouraged her to eat them in their whole form.    Expected Outcome STG: Enjoy colorful produce and priortize whole foods. LTG: Follow a heart healthy diet          Psychosocial: Target Goals: Acknowledge presence or absence of significant depression and/or stress, maximize coping skills, provide positive support system. Participant is able to verbalize types and ability to use techniques and skills needed for reducing stress and depression.   Education: Stress, Anxiety, and Depression - Group verbal and visual presentation to define topics covered.  Reviews how body is impacted by stress, anxiety, and depression.  Also discusses healthy ways to reduce stress and to treat/manage anxiety and depression.  Written material given at graduation. Flowsheet Row Cardiac Rehab from 01/19/2024 in [facility] Cardiac [facility] Rehab  Date 01/05/24  Educator sb  Instruction Review Code 1- [employer] Understanding    Education: Sleep Hygiene -Provides group verbal and written instruction about how sleep can affect your health.  Define sleep hygiene, discuss sleep cycles and impact of sleep habits.  Review good sleep hygiene tips.    Initial Review & Psychosocial Screening:  Initial Psych Review & Screening - 10/25/23 1424       Initial Review   Current issues with None Identified      Family Dynamics   Good Support System? Yes    Comments She lives alone and has a good support system, Her children live close [place] and has three dogs. She states no issues with mental illness and does not take anything for her mood.      Barriers   Psychosocial barriers to participate in program There are no identifiable barriers or psychosocial needs.;The patient should benefit from training in stress management and relaxation.      Screening Interventions   Interventions Encouraged to exercise;To provide support and resources with identified psychosocial needs;Provide feedback about the scores to participant    Expected Outcomes Short Term goal: Utilizing psychosocial counselor, staff and physician to assist with identification of specific Stressors or current issues interfering with healing process. Setting desired goal for each stressor or current issue identified.;Long Term Goal: Stressors or current issues are controlled or eliminated.;Short Term goal: Identification and review with participant of any Quality of Life or Depression concerns found by scoring the questionnaire.;Long Term goal: The participant improves quality of  Life and PHQ9 Scores as seen by post scores and/or verbalization of changes          Quality of Life Scores:   Quality of Life - 01/14/24 0746       Quality of Life Scores   Health/Function Pre 19 %    Health/Function Post 24.63 %    Health/Function % Change 29.63 %    Socioeconomic Pre 25.63 %    Socioeconomic Post 24.13 %    Socioeconomic % Change  -5.85 %    Psych/Spiritual Pre 23.93 %    Psych/Spiritual Post 23.07 %    Psych/Spiritual % Change -3.59 %    Family Pre 19.3 %    Family Post 24 %    Family % Change 24.35 %    GLOBAL Pre 21.54 %    GLOBAL Post  24.11 %    GLOBAL % Change 11.93 %         Scores of 19 and below usually indicate a poorer quality of life in these areas.  A difference of  2-3 points is a clinically meaningful difference.  A difference of 2-3 points in the total score of the Quality of Life Index has been associated with significant improvement in overall quality of life, self-image, physical symptoms, and general health in studies assessing change in quality of life.  PHQ-9: Review Flowsheet       01/14/2024 11/01/2023  Depression screen PHQ 2/9  Decreased Interest - 0  Down, Depressed, Hopeless 0 0  PHQ - 2 Score 0 0  Altered sleeping - 1  Tired, decreased energy 1 1  Change in appetite 1 1  Feeling bad or failure about yourself  0 0  Trouble concentrating 0 0  Moving slowly or fidgety/restless 0 0  Suicidal thoughts 0 0  PHQ-9 Score - 3  Difficult doing work/chores Somewhat difficult Not difficult at all   Interpretation of Total Score  Total Score Depression Severity:  1-4 = Minimal depression, 5-9 = Mild depression, 10-14 = Moderate depression, 15-19 = Moderately severe depression, 20-27 = Severe depression   Psychosocial Evaluation and Intervention:  Psychosocial Evaluation - 10/25/23 1443       Psychosocial Evaluation & Interventions   Interventions Encouraged to exercise with the program and follow exercise prescription;Relaxation education;Stress management education    Comments She lives alone and has a good support system, Her children live close [place] and has three dogs. She states no issues with mental illness and does not take anything for her mood.    Expected Outcomes Short: Start [facility] to help with mood. Long: Maintain a healthy mental state    Continue Psychosocial Services  Follow up required by staff          Psychosocial Re-Evaluation:  Psychosocial Re-Evaluation     Row Name 12/13/23 1716 12/27/23 0801 01/03/24 0755         Psychosocial Re-Evaluation   Current issues  with -- Current Sleep Concerns Current Sleep Concerns     Comments Dana Irwin states that she is having a little bit of stress as of late. Her daughter recently had an accident with the weed-eater, and her grandchildren are living with her. She is not having sleep concerns at this time, but does say that she gets about 5 hours of sleep nightly. Dana Irwin says she is not sleeping much, goes to bed late. Usually around 11 or 12 and wakes up around 5am most mornings. Encouraged her to go to bed a few hours  eariler. Dana Irwin says she has not been going to sleep earlier like discussed a week ago. Encouraged her to work on better sleep habits     Expected Outcomes Short: Continue to work with her daughter, and manage her stress in healthy ways. Long: Continue to exercise in order to relieve stress. STG: Focus on going to bed eariler. LTG: Continue to exercise in order to relieve stress. STG: Focus on going to bed eariler. LTG: Continue to exercise in order to relieve stress.     Interventions Encouraged to attend Cardiac Rehabilitation for the exercise Encouraged to attend Cardiac Rehabilitation for the exercise Encouraged to attend Cardiac Rehabilitation for the exercise     Continue Psychosocial Services  Follow up required by staff Follow up required by staff Follow up required by staff        Psychosocial Discharge (Final Psychosocial Re-Evaluation):  Psychosocial Re-Evaluation - 01/03/24 0755       Psychosocial Re-Evaluation   Current issues with Current Sleep Concerns    Comments Dana Irwin says she has not been going to sleep earlier like discussed a week ago. Encouraged her to work on better sleep habits    Expected Outcomes STG: Focus on going to bed eariler. LTG: Continue to exercise in order to relieve stress.    Interventions Encouraged to attend Cardiac Rehabilitation for the exercise    Continue Psychosocial Services  Follow up required by staff          Vocational Rehabilitation: Provide vocational rehab  assistance to qualifying candidates.   Vocational Rehab Evaluation & Intervention:   Education: Education Goals: Education classes will be provided on a variety of topics geared toward better understanding of heart health and risk factor modification. Participant will state understanding/return demonstration of topics presented as noted by education test scores.  Learning Barriers/Preferences:  Learning Barriers/Preferences - 10/25/23 1424       Learning Barriers/Preferences   Learning Barriers None    Learning Preferences None          General Cardiac Education Topics:  AED/CPR: - Group verbal and written instruction with the use of models to demonstrate the basic use of the AED with the basic ABC's of resuscitation.   Anatomy and Cardiac Procedures: - Group verbal and visual presentation and models provide information about basic cardiac anatomy and function. Reviews the testing methods done to diagnose heart disease and the outcomes of the test results. Describes the treatment choices: Medical Management, Angioplasty, or Coronary Bypass Surgery for treating various heart conditions including Myocardial Infarction, Angina, Valve Disease, and Cardiac Arrhythmias.  Written material given at graduation. Flowsheet Row Cardiac Rehab from 01/19/2024 in [facility] Cardiac and Pulmonary Rehab  Education need identified 11/01/23  Date 12/08/23  Educator SB  Instruction Review Code 1- Verbalizes Understanding    Medication Safety: - Group verbal and visual instruction to review commonly prescribed medications for heart and lung disease. Reviews the medication, class of the drug, and side effects. Includes the steps to properly store meds and maintain the prescription regimen.  Written material given at graduation.   Intimacy: - Group verbal instruction through game format to discuss how heart and lung disease can affect sexual intimacy. Written material given at graduation..   Know Your  Numbers and Heart Failure: - Group verbal and visual instruction to discuss disease risk factors for cardiac and pulmonary disease and treatment options.  Reviews associated critical values for Overweight/Obesity, Hypertension, Cholesterol, and Diabetes.  Discusses basics of heart failure: signs/symptoms and treatments.  Introduces Heart Failure Zone chart for action plan for heart failure.  Written material given at graduation. Flowsheet Row Cardiac Rehab from 01/19/2024 in [facility] Cardiac and Pulmonary Rehab  Education need identified 11/01/23    Infection Prevention: - Provides verbal and written material to individual with discussion of infection control including proper hand washing and proper equipment cleaning during exercise session. Flowsheet Row Cardiac Rehab from 01/19/2024 in [facility] Cardiac and Pulmonary Rehab  Date 10/25/23  Educator jh  Instruction Review Code 1- Verbalizes Understanding    Falls Prevention: - Provides verbal and written material to individual with discussion of falls prevention and safety. Flowsheet Row Cardiac Rehab from 01/19/2024 in [facility] Cardiac and Pulmonary Rehab  Date 10/25/23  Educator jh  Instruction Review Code 1- Verbalizes Understanding    Other: -Provides group and verbal instruction on various topics (see comments)   Knowledge Questionnaire Score:  Knowledge Questionnaire Score - 01/14/24 0746       Knowledge Questionnaire Score   Pre Score 18/26    Post Score 24/26          Core Components/Risk Factors/Patient Goals at Admission:  Personal Goals and Risk Factors at Admission - 10/25/23 1424       Core Components/Risk Factors/Patient Goals on Admission    Weight Management Yes;Weight Loss    Intervention Weight Management: Provide education and appropriate resources to help participant work on and attain dietary goals.;Weight Management: Develop a combined nutrition and exercise program designed to reach desired caloric intake, while  maintaining appropriate intake of nutrient and fiber, sodium and fats, and appropriate energy expenditure required for the weight goal.;Weight Management/Obesity: Establish reasonable short term and long term weight goals.;Obesity: Provide education and appropriate resources to help participant work on and attain dietary goals.    Expected Outcomes Short Term: Continue to assess and modify interventions until short term weight is achieved;Understanding recommendations for meals to include 15-35% energy as protein, 25-35% energy from fat, 35-60% energy from carbohydrates, less than 200mg  of dietary cholesterol, 20-35 gm of total fiber daily;Understanding of distribution of calorie intake throughout the day with the consumption of 4-5 meals/snacks;Weight Loss: Understanding of general recommendations for a balanced deficit meal plan, which promotes 1-2 lb weight loss per week and includes a negative energy balance of [phone] kcal/d    Hypertension Yes    Intervention Provide education on lifestyle modifcations including regular physical activity/exercise, weight management, moderate sodium restriction and increased consumption of fresh fruit, vegetables, and low fat dairy, alcohol moderation, and smoking cessation.;Monitor prescription use compliance.    Expected Outcomes Short Term: Continued assessment and intervention until BP is < 140/10mm HG in hypertensive participants. < 130/22mm HG in hypertensive participants with diabetes, heart failure or chronic kidney disease.;Long Term: Maintenance of blood pressure at goal levels.    Lipids Yes    Intervention Provide education and support for participant on nutrition & aerobic/resistive exercise along with prescribed medications to achieve LDL 70mg , HDL >40mg .    Expected Outcomes Short Term: Participant states understanding of desired cholesterol values and is compliant with medications prescribed. Participant is following exercise prescription and nutrition  guidelines.;Long Term: Cholesterol controlled with medications as prescribed, with individualized exercise RX and with personalized nutrition plan. Value goals: LDL < 70mg , HDL > 40 mg.          Education:Diabetes - Individual verbal and written instruction to review signs/symptoms of diabetes, desired ranges of glucose level fasting, after meals and with exercise. Acknowledge that pre and post exercise glucose  checks will be done for 3 sessions at entry of program.   Core Components/Risk Factors/Patient Goals Review:   Goals and Risk Factor Review     Row Name 12/13/23 1723 12/27/23 0804 01/03/24 0757         Core Components/Risk Factors/Patient Goals Review   Personal Goals Review Weight Management/Obesity;Hypertension Hypertension Hypertension     Review Dana Irwin is still trying to lose weight. She states her long term weight goal is 120lb. She is currently exercising in the program and at a gym on her off days. Dana Irwin is still taking her BP at least once a day. She states that she tries to take it every morning, and after dinner. Dana Irwin reports she checks her BP at home, that it matches closely with the readings taken here at rehab. Dana Irwin continues to check her BP at home, reports it is stable and matches closely with readings here at rehab     Expected Outcomes Short: Continue to take BP and manage diet. Long: Talk to the RD, continue to manage hypertension through exercise and diet. STG: Continue to check BP at home. LTG: manage risk factors independently STG: Continue to check BP at home. LTG: manage risk factors independently        Core Components/Risk Factors/Patient Goals at Discharge (Final Review):   Goals and Risk Factor Review - 01/03/24 0757       Core Components/Risk Factors/Patient Goals Review   Personal Goals Review Hypertension    Review Dana Irwin continues to check her BP at home, reports it is stable and matches closely with readings here at rehab    Expected Outcomes STG: Continue  to check BP at home. LTG: manage risk factors independently          ITP Comments:  ITP Comments     Row Name 10/25/23 1423 11/01/23 1026 11/03/23 0829 11/24/23 1013 12/22/23 1200   ITP Comments Virtual Visit completed. Patient informed on EP and RD appointment and 6 Minute walk test. Patient also informed of patient health questionnaires on My Chart. Patient Verbalizes understanding. Visit diagnosis can be found in [facility] 08/24/2023. Completed and gym orientation for cardiac rehab. Initial ITP created and sent for review to Dr. Oneil Pinal, Medical Director. First full day of exercise!  Patient was oriented to gym and equipment including functions, settings, policies, and procedures.  Patient's individual exercise prescription and treatment plan were reviewed.  All starting workloads were established based on the results of the 6 minute walk test done at initial orientation visit.  The plan for exercise progression was also introduced and progression will be customized based on patient's performance and goals. 30 Day review completed. Medical Director ITP review done, changes made as directed, and signed approval by Medical Director.    new toprgram 30 Day review completed. Medical Director ITP review done, changes made as directed, and signed approval by Medical Director.    Row Name 01/19/24 0846 01/24/24 0756         ITP Comments 30 Day review completed. Medical Director ITP review done, changes made as directed, and signed approval by Medical Director. Dana Irwin graduated today from  rehab with 36 sessions completed.  Details of the patient's exercise prescription and what She needs to do in order to continue the prescription and progress were discussed with patient.  Patient was given a copy of prescription and goals.  Patient verbalized understanding. Dana Irwin plans to continue to exercise by going to [employer].  Comments: discharge ITP

## 2024-01-24 NOTE — Progress Notes (Signed)
 Daily Session Note  Patient Details  Name: Dana Irwin MRN: [id number] Date of Birth: [birth year] Referring Provider:   Flowsheet Row Cardiac Rehab from 11/01/2023 in [facility] Cardiac and Pulmonary Rehab  Referring Provider Dr. Margery Ruth, MD    Encounter Date: 01/24/2024  Check In:  Session Check In - 01/24/24 0747       Check-In   Supervising physician immediately available to respond to emergencies See telemetry face sheet for immediately available ER MD    Location [facility]-Cardiac & Pulmonary Rehab    Staff Present Burnard Davenport RN,BSN,MPA;Joseph [facility] Dyane BS, ACSM CEP, Exercise Physiologist;Jason Elnor RDN,LDN    Virtual Visit No    Medication changes reported     No    Fall or balance concerns reported    No    Warm-up and Cool-down Performed on first and last piece of equipment    Resistance Training Performed Yes    VAD Patient? No    PAD/SET Patient? No      Pain Assessment   Currently in Pain? No/denies             Social History   Tobacco Use  Smoking Status Never  Smokeless Tobacco Not on file    Goals Met:  Independence with exercise equipment Exercise tolerated well No report of concerns or symptoms today Strength training completed today  Goals Unmet:  Not Applicable  Comments:  Rhiana graduated today from  rehab with 36 sessions completed.  Details of the patient's exercise prescription and what She needs to do in order to continue the prescription and progress were discussed with patient.  Patient was given a copy of prescription and goals.  Patient verbalized understanding. Kabrea plans to continue to exercise by going to [employer].  Cardiac:  Reviewed home exercise with pt today.  Pt plans to go to [employer] for exercise.  Reviewed THR, pulse, RPE, sign and symptoms, pulse oximetery and when to call 911 or MD.  Also discussed weather considerations and indoor options.  Pt voiced understanding.    Dr. Oneil Pinal is  Medical Director for [facility] Cardiac Rehabilitation.  Dr. Fuad Aleskerov is Medical Director for [facility] Pulmonary Rehabilitation.

## 2024-01-24 NOTE — Progress Notes (Signed)
 Discharge Summary Name: Dana Irwin DOB: 02-16-1956  [name] graduated today from  rehab with 36 sessions completed.  Details of the patient's exercise prescription and what She needs to do in order to continue the prescription and progress were discussed with patient.  Patient was given a copy of prescription and goals.  Patient verbalized understanding. [name] plans to continue to exercise by going to [employer].   6 Minute Walk     Row Name 11/01/23 1034 01/12/24 0757       6 Minute Walk   Phase Initial Discharge    Distance 1245 feet 1530 feet    Distance % Change -- 22.89 %    Distance Feet Change -- 285 ft    Walk Time 6 minutes 6 minutes    # of Rest Breaks 0 0    MPH 2.36 2.9    METS 2.29 3.11    RPE 7 12    Perceived Dyspnea  0 0    VO2 Peak 8 10.89    Symptoms No No    Resting HR 67 bpm 88 bpm    Resting BP 132/76 120/70    Resting Oxygen Saturation  97 % 94 %    Exercise Oxygen Saturation  during 6 min walk 96 % 93 %    Max Ex. HR 102 bpm 117 bpm    Max Ex. BP 146/84 162/80    2 Minute Post BP 140/82 138/78

## 2024-01-26 ENCOUNTER — Encounter

## 2024-01-28 ENCOUNTER — Encounter

## 2024-01-31 ENCOUNTER — Encounter

## 2024-02-02 ENCOUNTER — Encounter

## 2024-07-06 ENCOUNTER — Other Ambulatory Visit: Payer: Self-pay | Admitting: Family Medicine

## 2024-07-06 DIAGNOSIS — Z1231 Encounter for screening mammogram for malignant neoplasm of breast: Secondary | ICD-10-CM

## 2024-08-01 ENCOUNTER — Encounter
